# Patient Record
Sex: Male | Born: 2000 | Race: White | Hispanic: No | Marital: Single | Smoking: Never smoker
Health system: Southern US, Community
[De-identification: ages and names within clinical notes are randomized; demographics above are authoritative.]

## PROBLEM LIST (undated history)

## (undated) DIAGNOSIS — R55 Syncope and collapse: Secondary | ICD-10-CM

---

## 2018-01-30 ENCOUNTER — Emergency Department
Admission: EM | Admit: 2018-01-30 | Discharge: 2018-01-30 | Disposition: A | Discharge status: Home / Self Care | Payer: TRICARE Prime—HMO | Attending: Emergency Medicine | Admitting: Emergency Medicine

## 2018-01-30 ENCOUNTER — Emergency Department: Payer: TRICARE Prime—HMO

## 2018-01-30 DIAGNOSIS — R079 Chest pain, unspecified: Secondary | ICD-10-CM | POA: Insufficient documentation

## 2018-01-30 HISTORY — DX: Syncope and collapse: R55

## 2018-01-30 LAB — CBC AND DIFFERENTIAL
Absolute NRBC: 0 10*3/uL (ref 0.00–0.00)
Basophils Absolute Automated: 0.01 10*3/uL (ref 0.00–0.08)
Basophils Automated: 0.2 %
Eosinophils Absolute Automated: 0.14 10*3/uL (ref 0.00–0.44)
Eosinophils Automated: 3.1 %
Hematocrit: 42.8 % (ref 37.6–49.6)
Hgb: 14.8 g/dL (ref 12.5–17.1)
Immature Granulocytes Absolute: 0.01 10*3/uL (ref 0.00–0.07)
Immature Granulocytes: 0.2 %
Lymphocytes Absolute Automated: 1.28 10*3/uL (ref 0.42–3.22)
Lymphocytes Automated: 27.9 %
MCH: 30.2 pg (ref 25.1–33.5)
MCHC: 34.6 g/dL (ref 31.5–35.8)
MCV: 87.3 fL (ref 78.0–96.0)
MPV: 9.6 fL (ref 8.9–12.5)
Monocytes Absolute Automated: 0.43 10*3/uL (ref 0.21–0.85)
Monocytes: 9.4 %
Neutrophils Absolute: 2.71 10*3/uL (ref 1.10–6.33)
Neutrophils: 59.2 %
Nucleated RBC: 0 /100 WBC (ref 0.0–0.0)
Platelets: 161 10*3/uL (ref 142–346)
RBC: 4.9 10*6/uL (ref 4.20–5.90)
RDW: 12 % (ref 11–15)
WBC: 4.58 10*3/uL (ref 3.10–9.50)

## 2018-01-30 LAB — COMPREHENSIVE METABOLIC PANEL
ALT: 25 U/L (ref 0–55)
AST (SGOT): 23 U/L (ref 5–34)
Albumin/Globulin Ratio: 2 (ref 0.9–2.2)
Albumin: 4.3 g/dL (ref 3.5–5.0)
Alkaline Phosphatase: 64 U/L — ABNORMAL LOW (ref 65–260)
Anion Gap: 9 (ref 5.0–15.0)
BUN: 18 mg/dL (ref 9.0–28.0)
Bilirubin, Total: 0.6 mg/dL (ref 0.2–1.2)
CO2: 24 mEq/L (ref 22–29)
Calcium: 9.1 mg/dL (ref 8.5–10.5)
Chloride: 108 mEq/L (ref 100–111)
Creatinine: 0.9 mg/dL (ref 0.7–1.3)
Globulin: 2.2 g/dL (ref 2.0–3.6)
Glucose: 87 mg/dL (ref 70–100)
Potassium: 4 mEq/L (ref 3.5–5.1)
Protein, Total: 6.5 g/dL (ref 6.0–8.3)
Sodium: 141 mEq/L (ref 136–145)

## 2018-01-30 LAB — HEMOLYSIS INDEX: Hemolysis Index: 11 (ref 0–18)

## 2018-01-30 LAB — TROPONIN I: Troponin I: <0.01 ng/mL (ref 0.00–0.09)

## 2018-01-30 NOTE — Discharge Instructions (Signed)
No exercise until cleared by a cardiologist.     Chest Pain of Unclear Etiology    You have been seen for chest pain. The cause of your pain is not yet known.    Your doctor has learned about your medical history, examined you, and checked any tests that were done. Still, it is unclear why you are having pain. The doctor thinks there is only a very small chance that your pain is caused by a life-threatening condition. Later, your primary care doctor might do more tests or check you again.    Sometimes chest pain is caused by a dangerous condition, like a heart attack, aorta injury, blood clot in the lung, or collapsed lung. It is unlikely that your pain is caused by a life-threatening condition if: Your chest pain lasts only a few seconds at a time; you are not short of breath, nauseated (sick to your stomach), sweaty, or lightheaded; your pain gets worse when you twist or bend; your pain improves with exercise or hard work.    Chest pain is serious. It is VERY IMPORTANT that you follow up with your regular doctor and seek medical attention immediately here or at the nearest Emergency Department if your symptoms become worse or they change.    YOU SHOULD SEEK MEDICAL ATTENTION IMMEDIATELY, EITHER HERE OR AT THE NEAREST EMERGENCY DEPARTMENT, IF ANY OF THE FOLLOWING OCCURS:   Your pain gets worse.   Your pain makes you short of breath, nauseated, or sweaty.   Your pain gets worse when you walk, go up stairs, or exert yourself.   You feel weak, lightheaded, or faint.   It hurts to breathe.   Your leg swells.   Your symptoms get worse or you have new symptoms or concerns.

## 2018-01-30 NOTE — ED Notes (Signed)
Bed: GR15  Expected date:   Expected time:   Means of arrival:   Comments:  Building control surveyor

## 2018-01-30 NOTE — ED Triage Notes (Signed)
Patient [employer] duty [employer] BIBA with c/c of non-radiating left sided Chest Pain and Shortness of Breath 30 minutes PTA. Patient states symptoms began during a routine PT Test. Patient states pain worsens with inspiration. Patient reports Hx: Left Sided Internal Loop Recorder placement 3 years ago after having a syncope episode. PT NAD

## 2018-01-30 NOTE — ED Provider Notes (Signed)
EMERGENCY DEPARTMENT HISTORY AND PHYSICAL EXAM     Physician/Midlevel provider first contact with patient: 01/30/18 1610         Date: 01/30/2018  Patient Name: Gregory Steele    History of Presenting Illness     Chief Complaint   Patient presents with   . Chest Pain   . Shortness of Breath       History Provided By: Patient    Chief Complaint: Chest pain  Duration: Since earlier today  Timing:  Intermittent  Location: Diffuse  Quality: uncomfortable  Severity: Moderate  Exacerbating factors: Onset by activity  Alleviating factors: None  Associated Symptoms: SOB  Pertinent Negatives: None    Additional History: Gregory Steele is a 17 y.o. male presenting to the ED with intermittent uncomfortable diffuse CP since earlier today. The pt states that he was at a PT today when he felt the CP and SOB. He states that this level of activity is normal for him and he has had these similar sxs in the past. He states that he has been seen for these sxs in the past but does not remember having any particular results.     PCP: No primary care provider on file.  SPECIALISTS:    No current facility-administered medications for this encounter.      No current outpatient medications on file.       Past History     Past Medical History:  Past Medical History:   Diagnosis Date   . Syncope        Past Surgical History:  Past Surgical History:   Procedure Laterality Date   . CARDIAC ASSIST DEVICE INSERTION  2016    Internal Loop Recorder   . TONSILLECTOMY         Family History:  History reviewed. No pertinent family history.    Social History:  Social History     Tobacco Use   . Smoking status: Never Smoker   . Smokeless tobacco: Current User     Types: Chew   Substance Use Topics   . Alcohol use: Not on file   . Drug use: Not on file       Allergies:  No Known Allergies    Review of Systems     Review of Systems   Constitutional: Negative for chills and fever.   Respiratory: Positive for shortness of breath.    Cardiovascular: Positive for chest  pain.   Gastrointestinal: Negative for diarrhea, nausea and vomiting.   All other systems reviewed and are negative.      Physical Exam   BP 108/59   Pulse 68   Temp 98.1 F (36.7 C)   Resp 22   Ht 5\' 10"  (1.778 m)   Wt 70.3 kg   SpO2 98%   BMI 22.24 kg/m     Physical Exam   Constitutional: He is oriented to person, place, and time. He appears well-developed and well-nourished. No distress.   HENT:   Head: Normocephalic and atraumatic.   Mouth/Throat: Oropharynx is clear and moist.   Eyes: Conjunctivae and EOM are normal.   Neck: Normal range of motion. Neck supple.   Cardiovascular: Normal rate, regular rhythm and normal heart sounds. Exam reveals no gallop and no friction rub.   No murmur heard.  Pulmonary/Chest: Effort normal and breath sounds normal. No respiratory distress. He has no wheezes. He has no rales.   Abdominal: Soft. Bowel sounds are normal. He exhibits no distension. There is no tenderness.  There is no rebound and no guarding. Musculoskeletal: Normal range of motion.         General: No edema.     Neurological: He is alert and oriented to person, place, and time.   Skin: Skin is warm and dry.   Psychiatric: He has a normal mood and affect. His behavior is normal.   Nursing note and vitals reviewed.          Diagnostic Study Results     Labs -     Results     Procedure Component Value Units Date/Time    Troponin I [[id number]] Collected:  01/30/18 0937    Specimen:  Blood Updated:  01/30/18 1023     Troponin I <0.01 ng/mL     Comprehensive metabolic panel [[id number]]  (Abnormal) Collected:  01/30/18 0937    Specimen:  Blood Updated:  01/30/18 1016     Glucose 87 mg/dL      BUN 78.2 mg/dL      Creatinine 0.9 mg/dL      Sodium 956 mEq/L      Potassium 4.0 mEq/L      Chloride 108 mEq/L      CO2 24 mEq/L      Calcium 9.1 mg/dL      Protein, Total 6.5 g/dL      Albumin 4.3 g/dL      AST (SGOT) 23 U/L      ALT 25 U/L      Alkaline Phosphatase 64 U/L      Bilirubin, Total 0.6 mg/dL      Globulin 2.2  g/dL      Albumin/Globulin Ratio 2.0     Anion Gap 9.0    Hemolysis index [[id number]] Collected:  01/30/18 0937     Updated:  01/30/18 1016     Hemolysis Index 11    CBC with differential [[id number]] Collected:  01/30/18 0937    Specimen:  Blood Updated:  01/30/18 1000     WBC 4.58 x10 3/uL      Hgb 14.8 g/dL      Hematocrit 41.3 %      Platelets 161 x10 3/uL      RBC 4.90 x10 6/uL      MCV 87.3 fL      MCH 30.2 pg      MCHC 34.6 g/dL      RDW 12 %      MPV 9.6 fL      Neutrophils 59.2 %      Lymphocytes Automated 27.9 %      Monocytes 9.4 %      Eosinophils Automated 3.1 %      Basophils Automated 0.2 %      Immature Granulocyte 0.2 %      Nucleated RBC 0.0 /100 WBC      Neutrophils Absolute 2.71 x10 3/uL      Abs Lymph Automated 1.28 x10 3/uL      Abs Mono Automated 0.43 x10 3/uL      Abs Eos Automated 0.14 x10 3/uL      Absolute Baso Automated 0.01 x10 3/uL      Absolute Immature Granulocyte 0.01 x10 3/uL      Absolute NRBC 0.00 x10 3/uL           Radiologic Studies -   Radiology Results (24 Hour)     Procedure Component Value Units Date/Time    Chest AP Portable [[id number]] Collected:  01/30/18 0936    Order Status:  Completed Updated:  01/30/18 0940    Narrative:       PORTABLE CHEST    CLINICAL STATEMENT: chest pain    COMPARISON: No prior studies are available for comparison.    FINDINGS: The lungs are clear. The cardiomediastinal silhouette is  unremarkable.          Impression:         No acute cardiopulmonary disease.    Fonnie Mu, MD   01/30/2018 9:36 AM      .    Medical Decision Making   I am the first provider for this patient.    I reviewed the vital signs, available nursing notes, past medical history, past surgical history, family history and social history.    Vital Signs-Reviewed the patient's vital signs.     Patient Vitals for the past 12 hrs:   BP Temp Pulse Resp   01/30/18 0858 108/59 98.1 F (36.7 C) 68 22       Pulse Oximetry Analysis - Normal    Cardiac Monitor:  Rate: 68  Rhythm:  Normal Sinus Rhythm     EKG:  Interpreted by the EP.   Time Interpreted: 0850   Rate: 85   Rhythm: Normal Sinus Rhythm w/ arrhthymia    Interpretation: right ventriclualr conduction delay normal axis, normal intermals, no stemis   Comparison: No prior study is available for comparison.    Clinical Decision Support:    Heart Score      Value   History  0   EKG  1   Risk Factors  0   Total (with age)  1   Onset of pain (time of START of last episode of chest pain)?  >6 hrs ago            ED Course:     9:30 AM - Discussed ED treatment plan including blood work w/ pt. Pt agrees.    10:33 AM - Updated pt on results. Discussed pt's diagnosis, f/u with Card[facility], home self care, discharge instructions, and return precautions with patient. Possibility of evolving illness reviewed. All questions solicited and addressed. Patient states understanding and amenable to discharge.       Provider Notes:   Based on my history, physical exam, and diagnostic evaluation, the patient appears to have symptoms consistent with low risk chest pain. The physical exam was unremarkable including normal chest and respiratory exam. The pt's diagnostic evaluation including laboratory data, EKG, and x-ray were unremarkable. The HEART score is 1, and according to the low risk chest pain pathway a second troponin 1 drawn at 3 hours.  Based on this evaluation, I do not believe the symptoms are related to acute ischemic chest pain. Pt will be discharged to follow-up with cardiology follow up. Patient was advised of our evaluation and told to return to the emergency department if symptoms change, worsen, or new symptoms develop.  [facility] called for arrangement of outpatient follow up. Pt advised not to return to physical exercise until cleared by a cardiology.     Diagnosis     Clinical Impression:   1. Chest pain, unspecified type        Treatment Plan:   ED Disposition     ED Disposition Condition Date/Time Comment    Discharge  Wed Jan 30, 2018 10:33 AM Cammy Brochure discharge to home/self care.    Condition at disposition: Stable            _______________________________  Attestations: This note is prepared by Janann August, acting as scribe for Mariane Duval, MD. The scribe's documentation has been prepared under my direction and personally reviewed by me in its entirety.  I confirm that the note above accurately reflects all work, treatment, procedures, and medical decision making performed by me.    _______________________________       Verlee Rossetti, MD  01/30/18 1047

## 2018-01-31 LAB — ECG 12-LEAD
Atrial Rate: 85 {beats}/min
P Axis: 71 degrees
P-R Interval: 124 ms
Q-T Interval: 388 ms
QRS Duration: 108 ms
QTC Calculation (Bezet): 461 ms
R Axis: 67 degrees
T Axis: 35 degrees
Ventricular Rate: 85 {beats}/min

## 2021-06-09 IMAGING — CR [HOSPITAL] ORBITS
1 series · 2 of 2 positions shown · non-contrast
Comparison: None.

HISTORY/INDICATIONS: Pre-MRI screening.
TECHNIQUE: Orbits 2 views

[Series 1: w waters pa · 0.15mm/px · 2 of 2 slices shown]
[im 1/2]
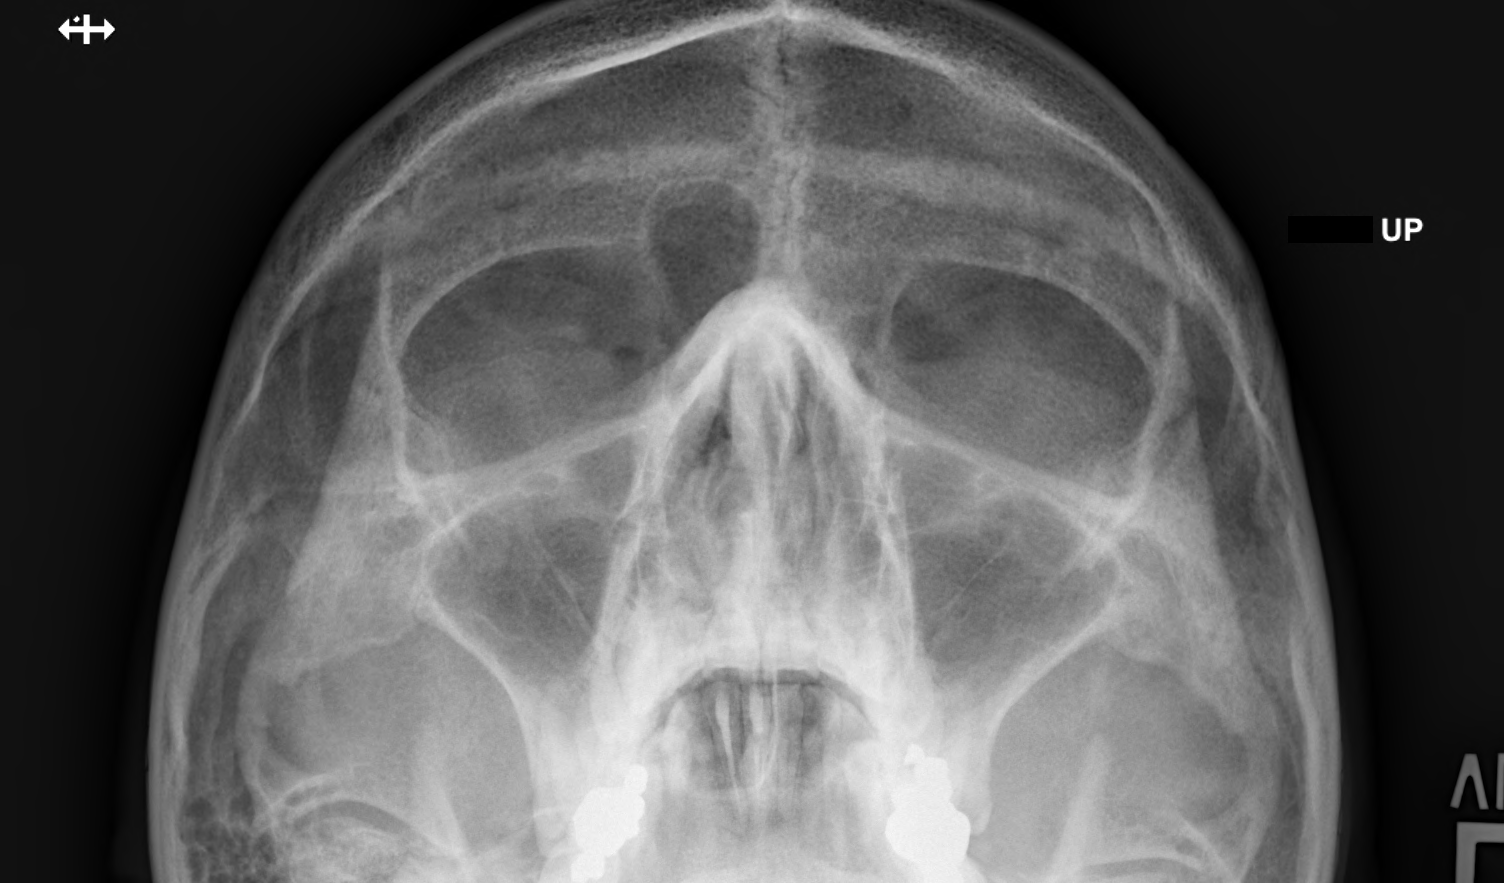
[im 2/2]
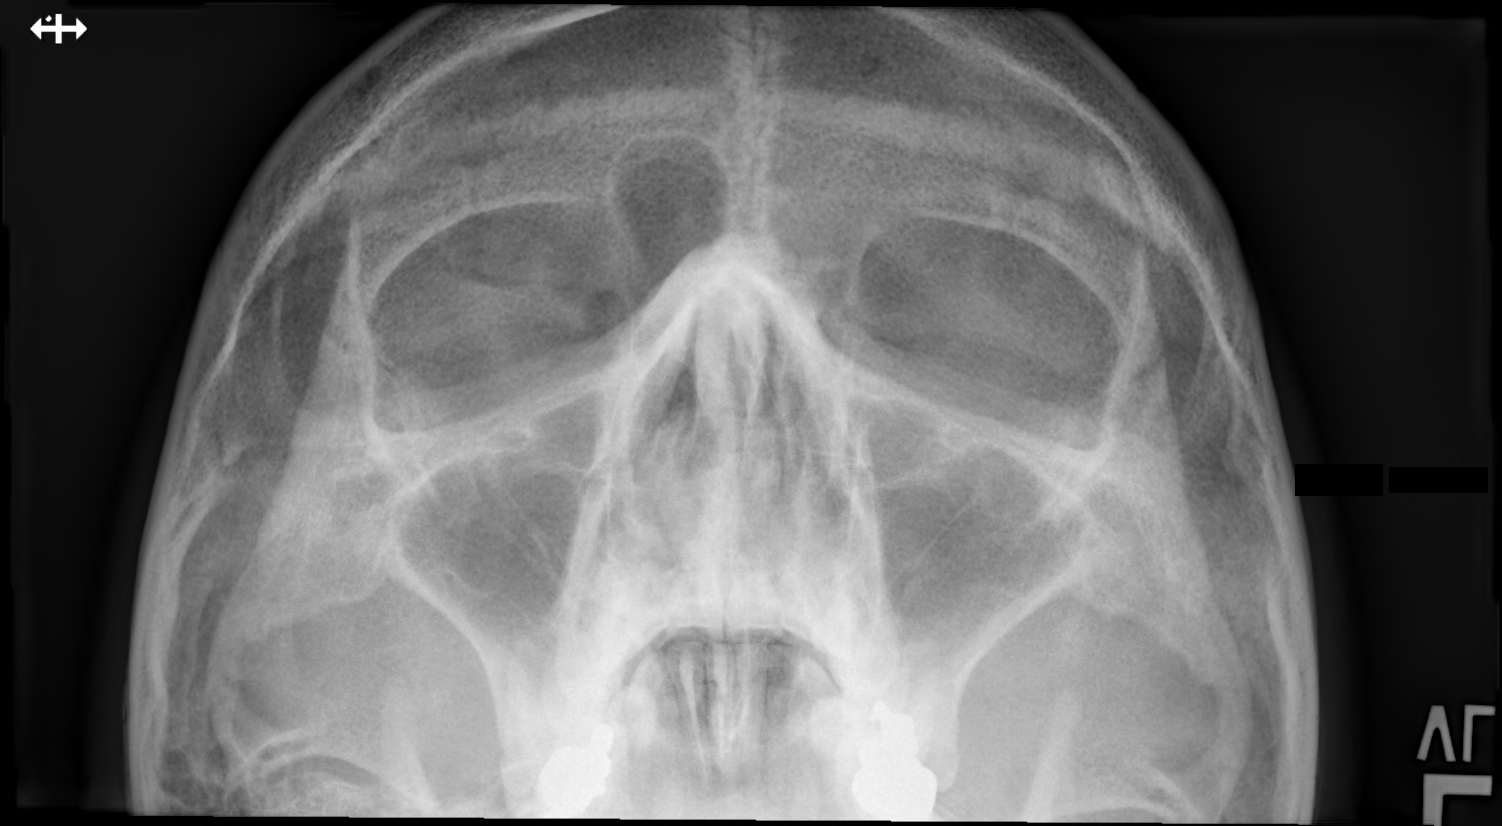

[2 of 2 positions shown; findings below may reference images not displayed]

FINDINGS: No radiopaque foreign object to preclude MRI.
IMPRESSION: The patient may undergo MRI.

## 2021-06-09 IMAGING — MR MRI BRAIN WO CONTRAST
8 series · 31 of 48 positions shown · non-contrast
Comparison: None.

INDICATION: 20-year-old male. Dizziness and giddiness.
TECHNIQUE: Multiplanar multisequence MRI of the brain was performed without intravenous contrast.

[Series 5: flair_axial_fs · axial · 4.0mm · 0.90mm/px · z∈[-110,+43]mm · 3 of 32 slices shown]
[im 1/32]
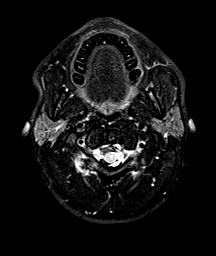
[im 16/32]
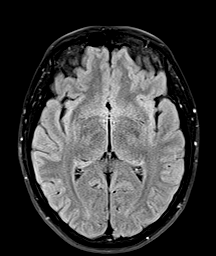
[im 32/32]
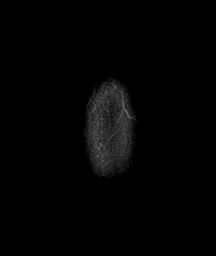

[Series 6: t2_axial · axial · 4.0mm · 0.36mm/px · z∈[-110,+43]mm · 2 of 32 slices shown]
[im 1/32]
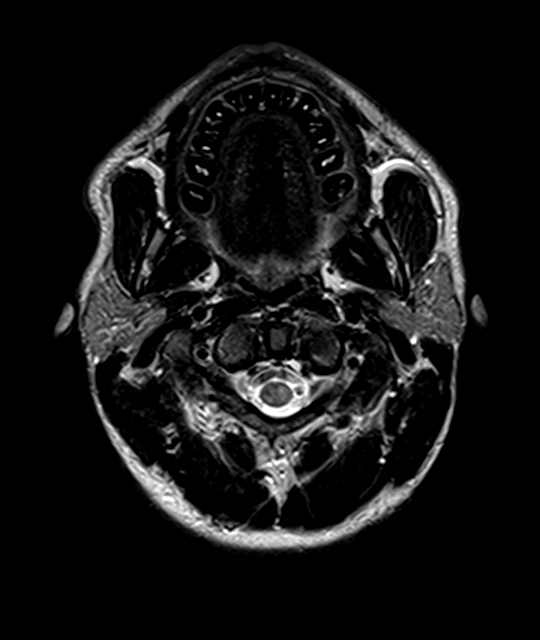
[im 32/32]
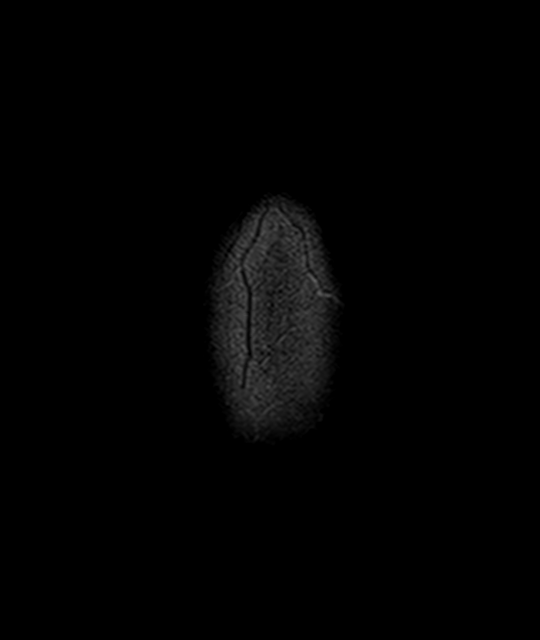

[Series 7: DWI · axial · 4.0mm · 1.31mm/px · z∈[-111,+43]mm · 2 of 32 slices shown (1 of 2)]
[im 1/32]
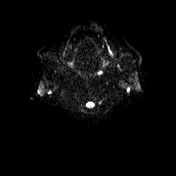
[im 32/32]
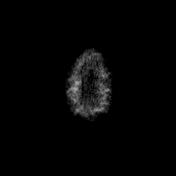

[Series 8: DWI · axial · 4.0mm · 1.31mm/px · z∈[-111,+43]mm · 2 of 31 slices shown (2 of 2)]
[im 1/31]
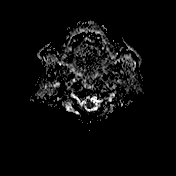
[im 31/31]
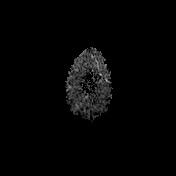

[Series 9: flash_axial · axial · 4.0mm · 0.90mm/px · z∈[-110,+43]mm · 2 of 32 slices shown]
[im 1/32]
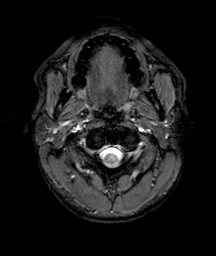
[im 32/32]
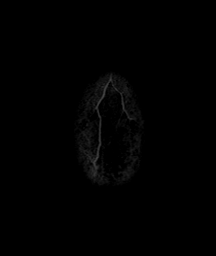

[Series 10: mprage_pre (no fs) · axial · 0.9mm · 0.92mm/px · z∈[-114,+51]mm · 8 of 192 slices shown]
[im 1/192]
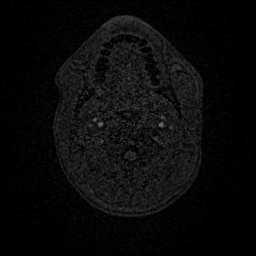
[im 32/192]
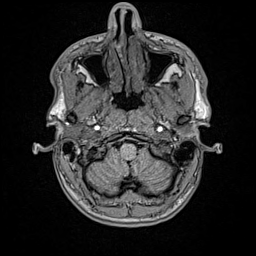
[im 64/192]
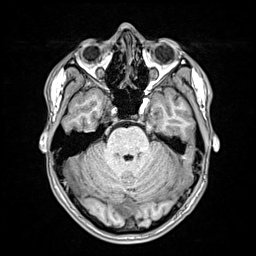
[im 80/192]
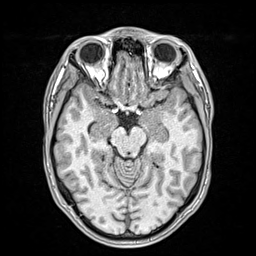
[im 112/192]
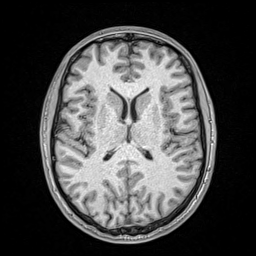
[im 128/192]
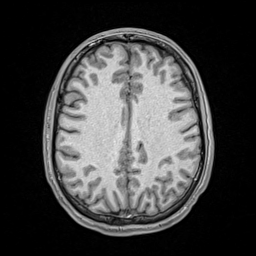
[im 160/192]
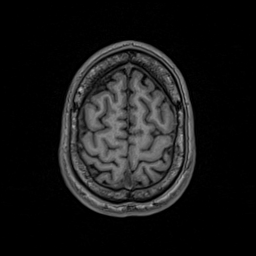
[im 192/192]
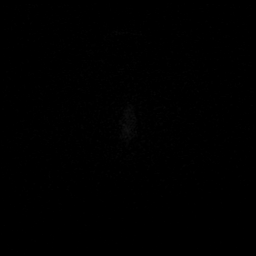

[Series 11: mprage_pre (no fs)_mpr_sagittal · sagittal · 1.0mm · 0.41mm/px · 8 of 150 slices shown]
[im 1/150]
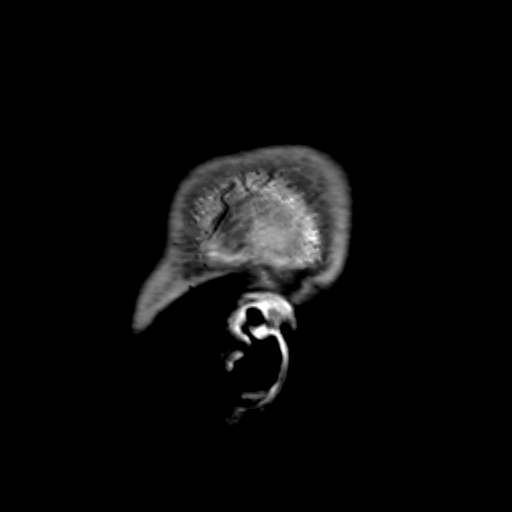
[im 17/150]
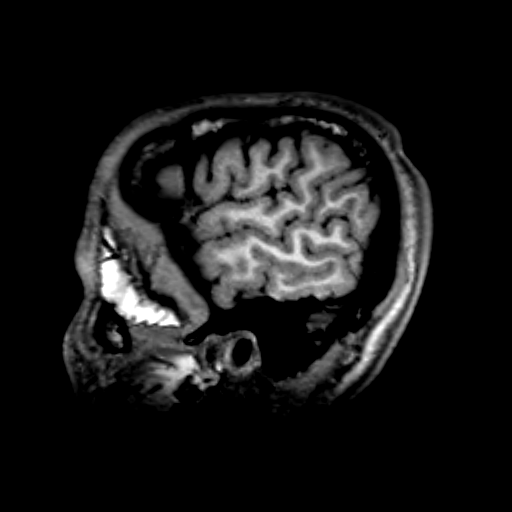
[im 50/150]
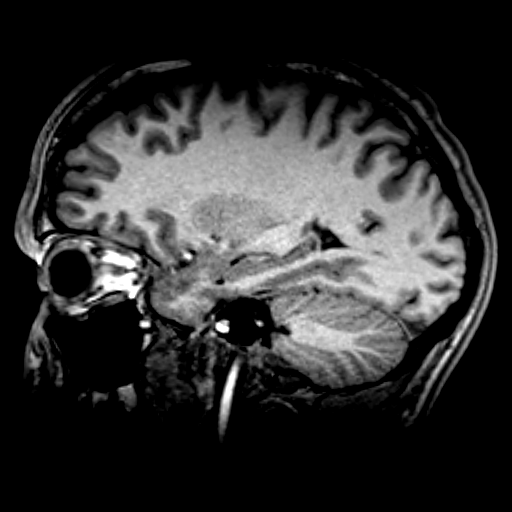
[im 67/150]
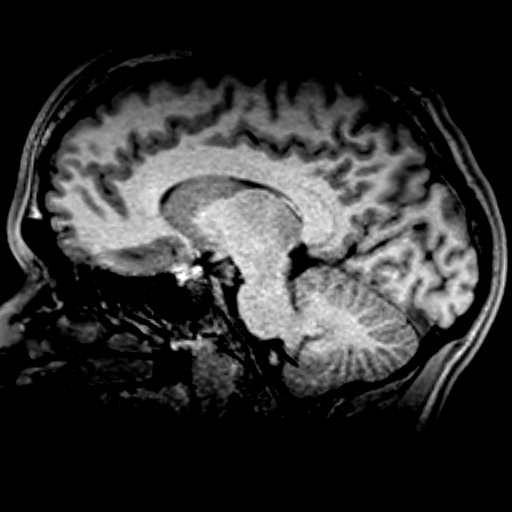
[im 83/150]
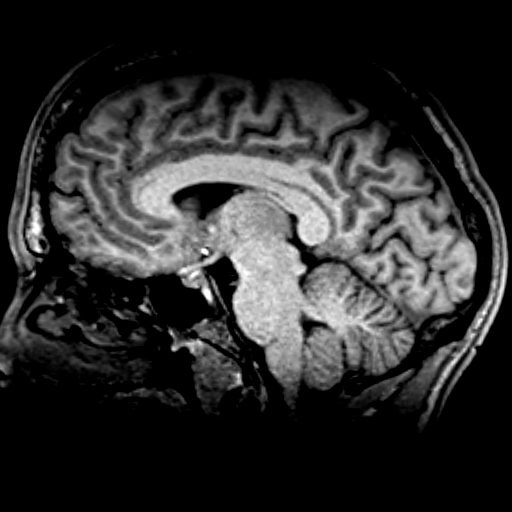
[im 100/150]
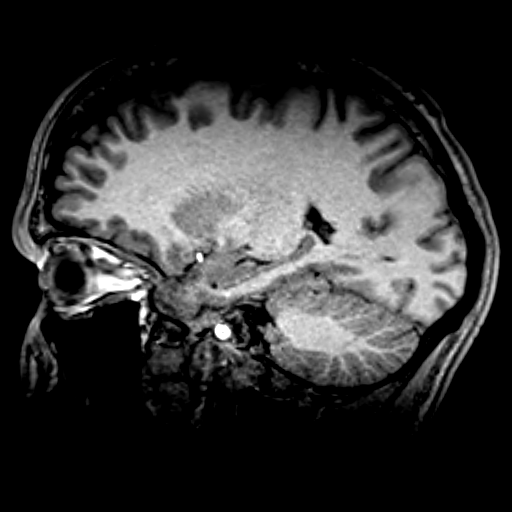
[im 133/150]
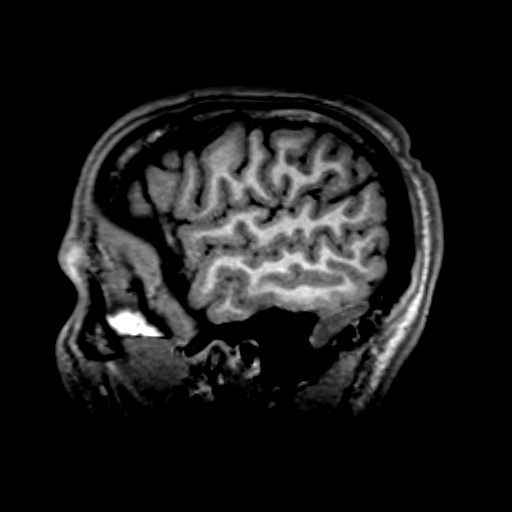
[im 150/150]
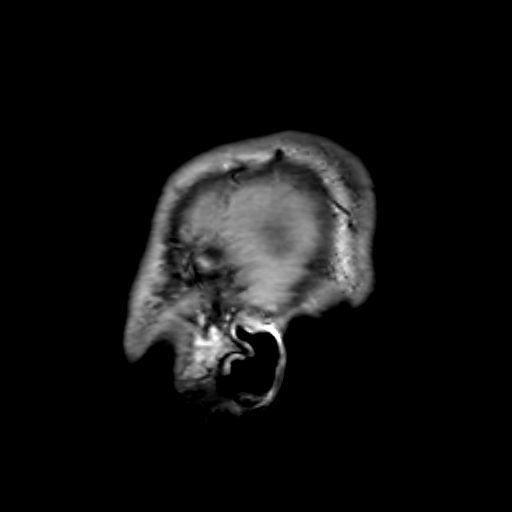

[Series 12: mprage_pre (no fs)_mpr_coronal · coronal · 1.0mm · 0.45mm/px · 4 of 209 slices shown]
[im 1/209]
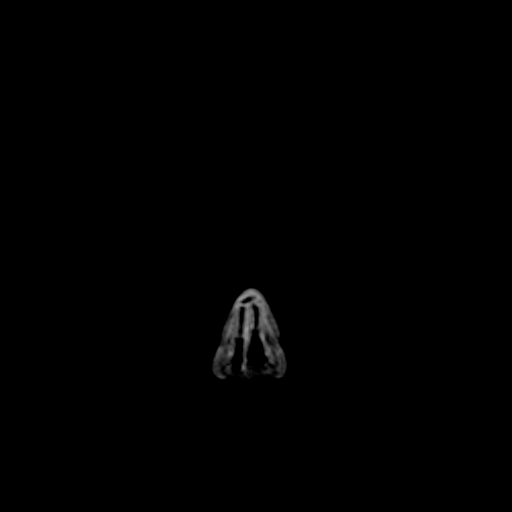
[im 33/209]
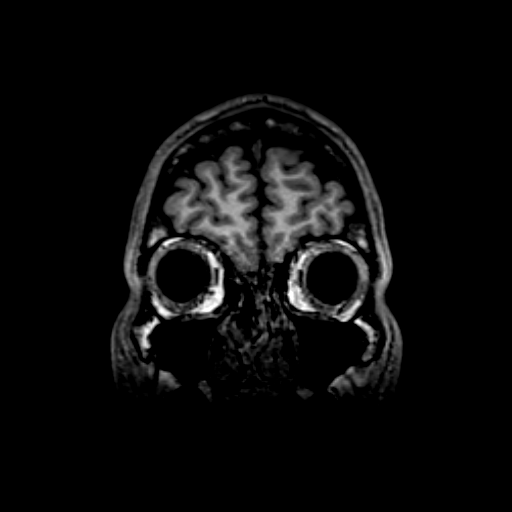
[im 65/209]
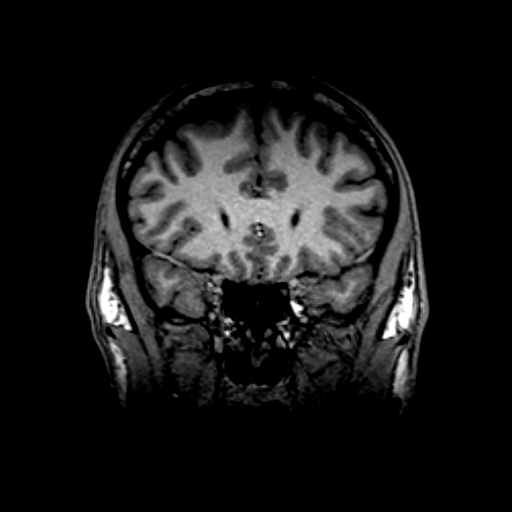
[im 97/209]
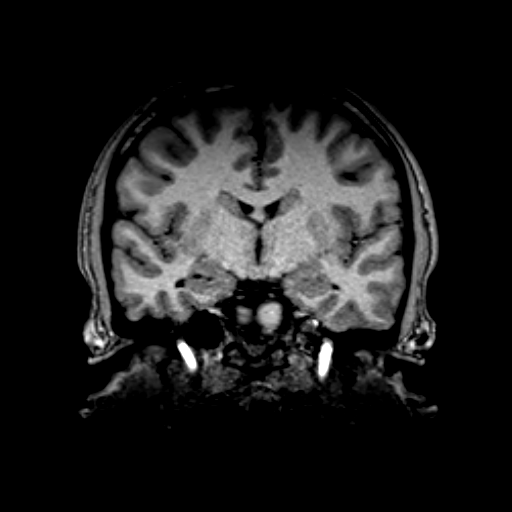

[31 of 48 positions shown; findings below may reference images not displayed]

FINDINGS: Brain parenchyma: No acute infarct or hemorrhage. No significant parenchymal abnormality. Preserved vascular flow voids.

Ventricles: Minimal cerebellar tonsillar ectopia by 2 to 3 mm with normal shape of the tonsils, which does not meet criteria for Chiari malformation. No midline shift. No hydrocephalus.

Extra-axial spaces: No extra-axial fluid collection.

Extracranial structures: Mild paranasal sinus mucosal disease. T2 hyperintense lesion in the right nasal cavity measuring 13 x 25 mm, likely a polyp. Marrow signal normal. Orbits unremarkable. Soft tissues normal.
IMPRESSION: 1.
No intracranial abnormality to explain patient's symptoms.

2.
Mild paranasal sinus mucosal disease. Likely right nasal cavity polyp measuring up to 25 mm.

## 2021-12-14 IMAGING — US BX AXILLARY BREAST LYMPH NODE RT
1 series · 13 of 17 positions shown · non-contrast
Comparison: Same day ultrasound and outside facility exams, most recently 11/01/2021
PROCEDURE:
PATIENT CONSENT: Prior to the procedure risks, complications, alternatives, and a description of the procedure were discussed.

Images Obtained from [facility]
INDICATION: Ultrasound-guided biopsy for a right axillary lymph node.

[Series 2: bx axillary breast lymph node right · 13 of 17 slices shown]
[im 1/17]
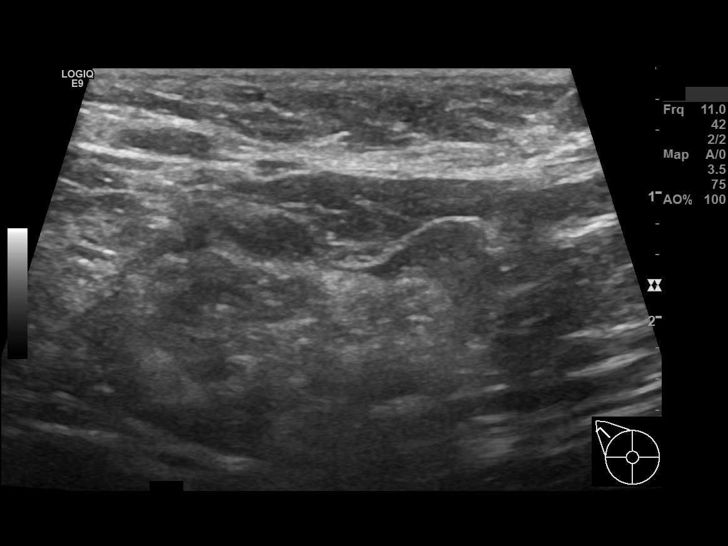
[im 2/17]
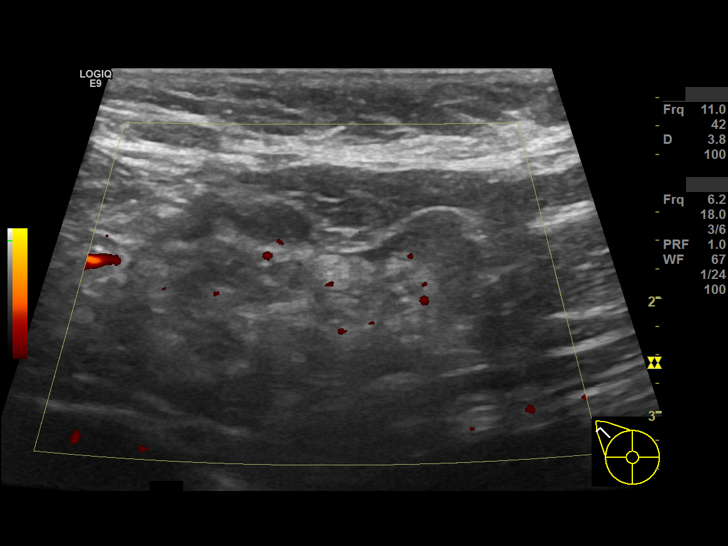
[im 4/17]
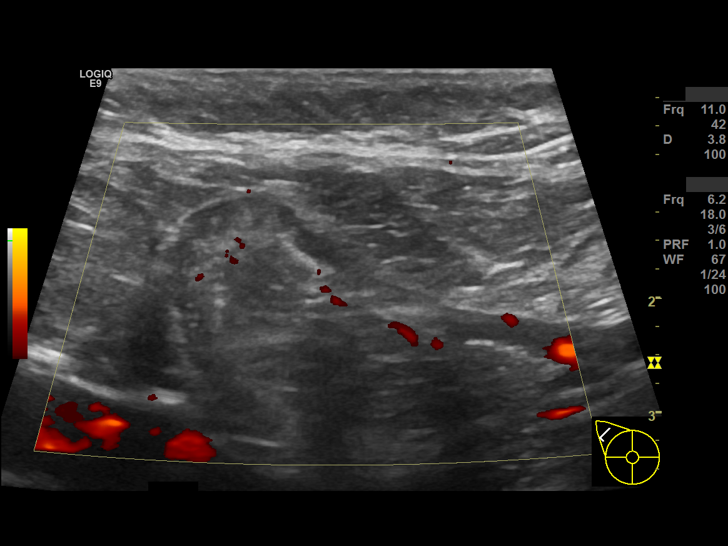
[im 5/17]
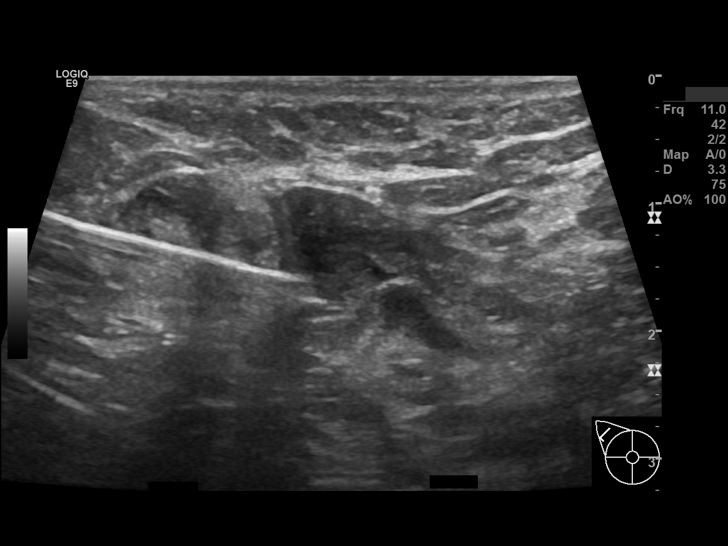
[im 6/17]
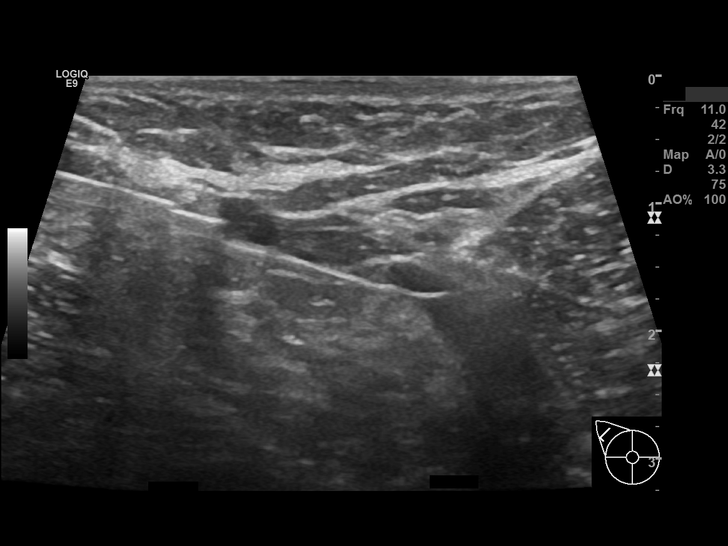
[im 8/17]
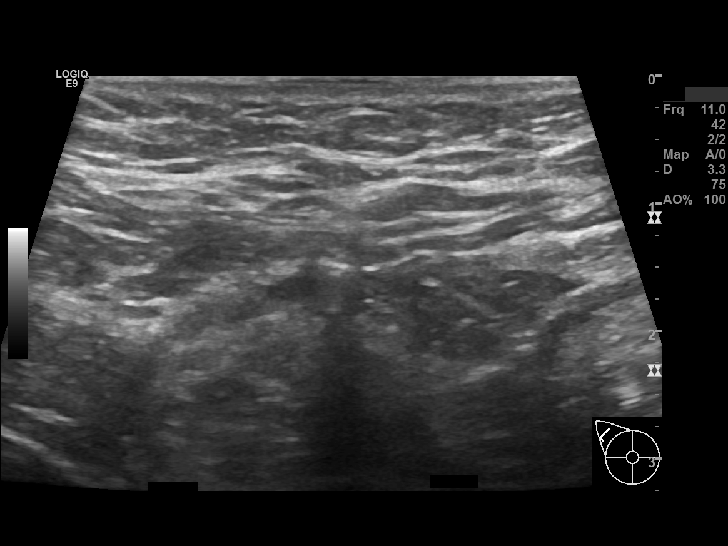
[im 9/17]
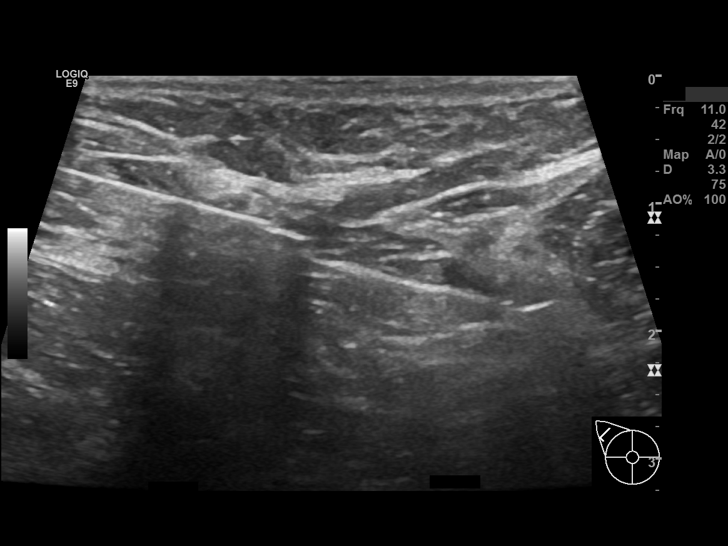
[im 10/17]
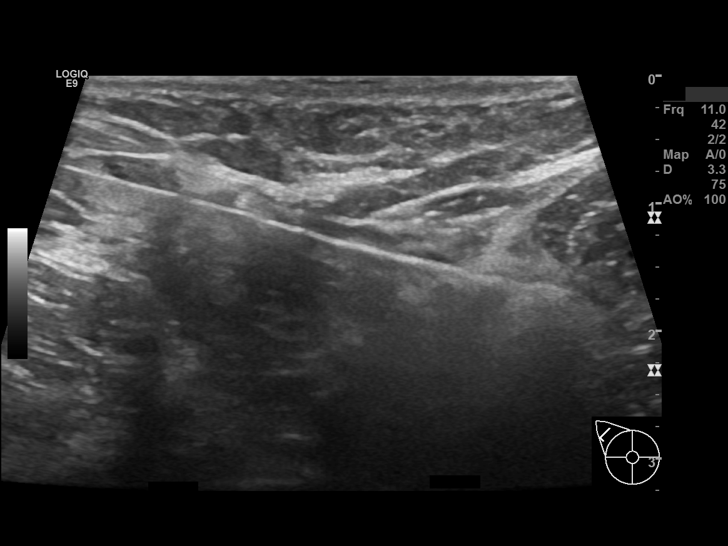
[im 12/17]
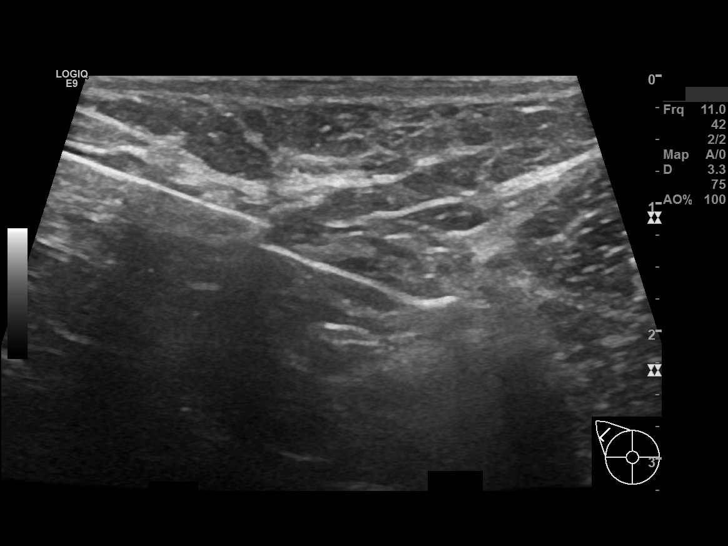
[im 13/17]
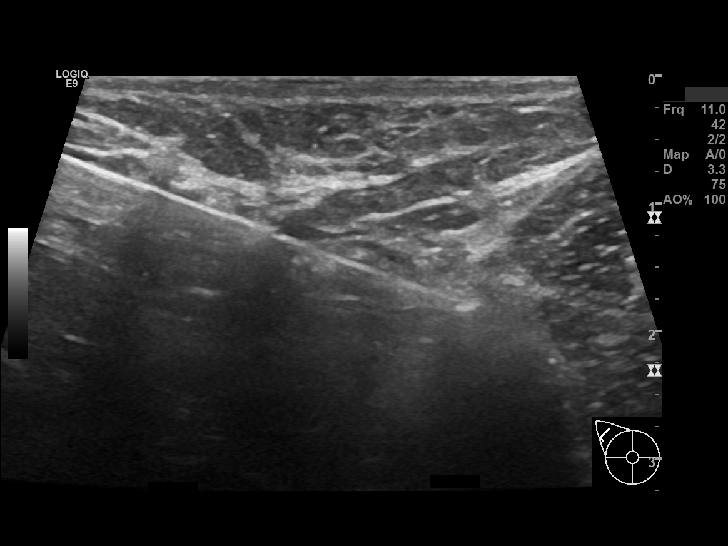
[im 14/17]
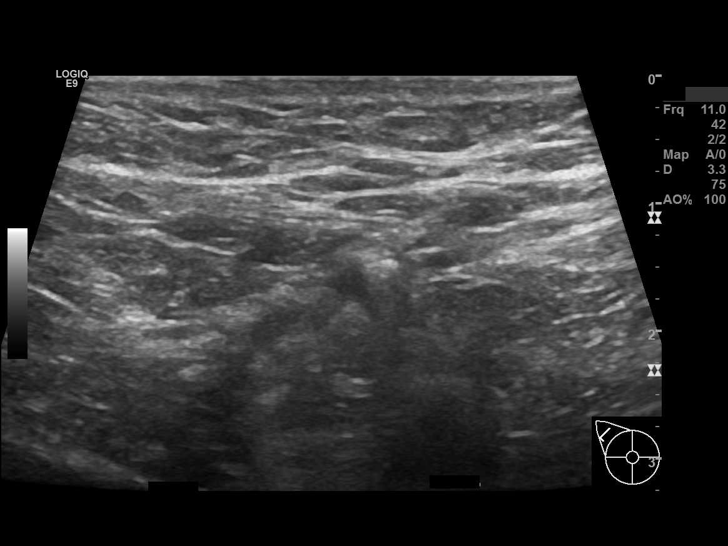
[im 16/17]
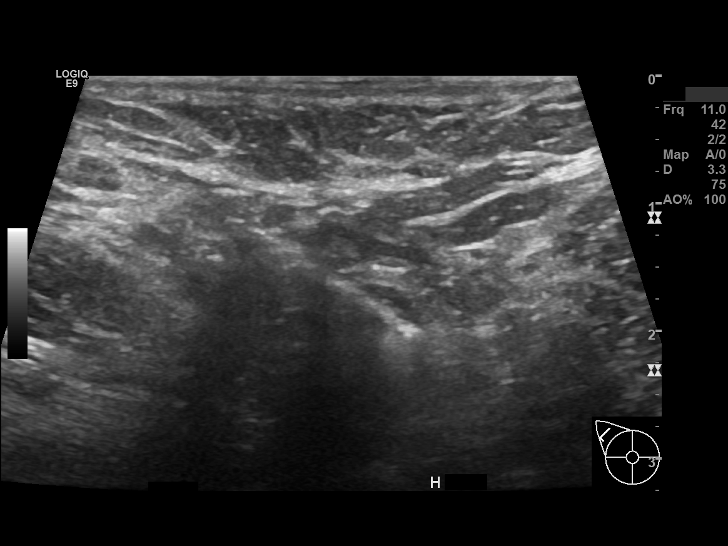
[im 17/17]
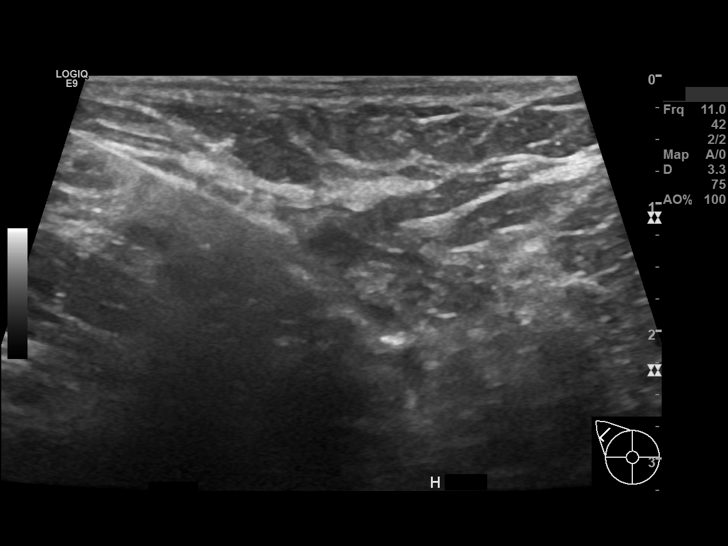

[13 of 17 positions shown; findings below may reference images not displayed]

All questions were answered.  Informed consent was obtained and signed.
PROCEDURE DESCRIPTION:
An ultrasound guided biopsy using real-time ultrasound was performed for the right axillary lymph node.  This was described on the previous ultrasound report.  The skin was prepped in the usual
manner.  Local anesthetic was administered to the access site.  A skin nick was made in the breast.  A 14 gauge biopsy needle was placed adjacent to the abnormality under ultrasound guidance.  Once
the needle was documented to be in the correct location, multiple specimens were obtained using the Marquee biopsy needle.  A hydromark butterfly-shaped clip was inserted into the biopsy cavity.  A
skin adhesive was applied to the access site.  Post procedure ultrasound imaging demonstrates the clip at the targeted area.  The specimens were sent to the laboratory for pathological analysis.
A female technologist was present throughout the procedure.
IMPRESSION: 1.  The ultrasound guided biopsy of the right axillary lymph node was successful with no apparent post procedure complications.
2.  Pathology indicates lymphoid proliferation. Per the pathology report, it was stated that "core biopsies of lymph node, particularly in one as sizable this, have decreased sensitivity for
characterizing focal pathology (e.g. certain subtypes of Lica)."
3.  Pathology results are concordant with imaging findings.
4.  After discussion with the patient, a repeat biopsy will be performed with cores sent for both pathology and RPMI, as opposed to a surgical consultation for removal.
Results and recommendations were discussed with the patient.

## 2021-12-14 IMAGING — US US AXILLARY RT (BREAST)
1 series · 10 of 10 positions shown · non-contrast
Comparison: The present examination has been compared to prior imaging studies.

Images Obtained from [facility]
HISTORY: Patient is 21 years old and is seen for preprocedural ultrasound guided biopsy imaging for a lymph node in the right axilla that has reportedly increased in size per outside institution reports.
TECHNIQUE: Real-time ultrasound of the right axilla was performed.

[Series 1: us axillary right (breast) · 10 of 10 slices shown]
[im 1/10]
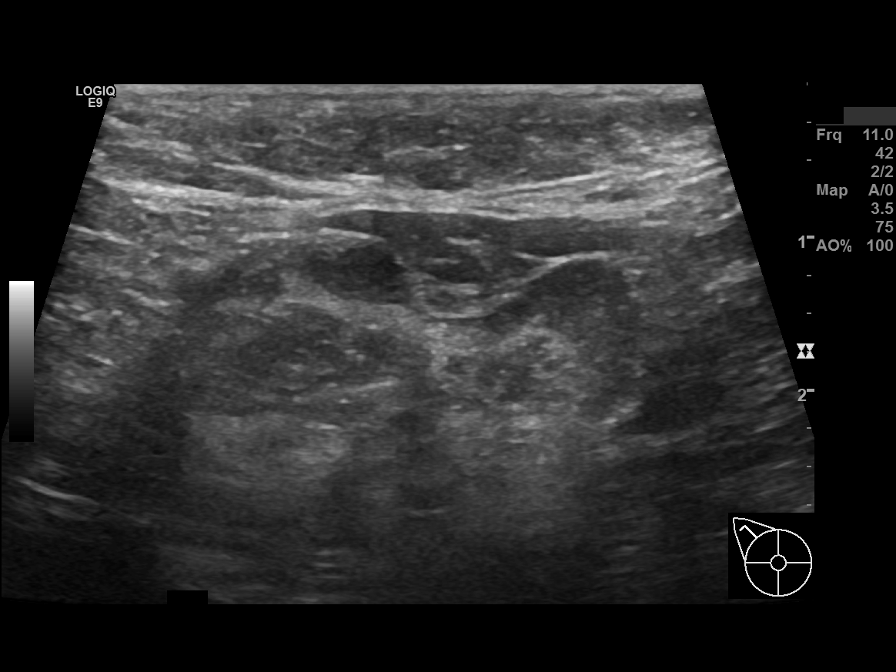
[im 2/10]
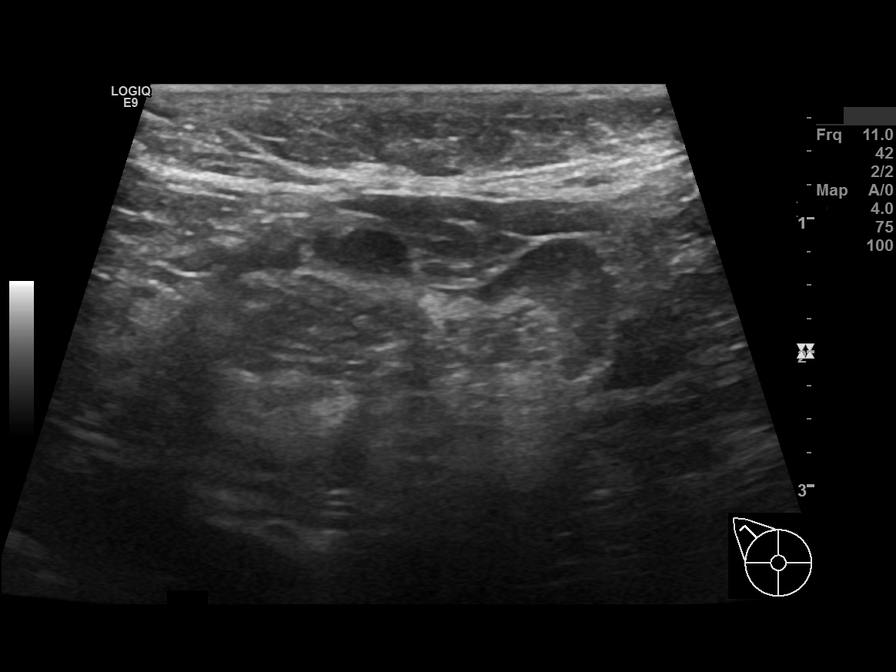
[im 3/10]
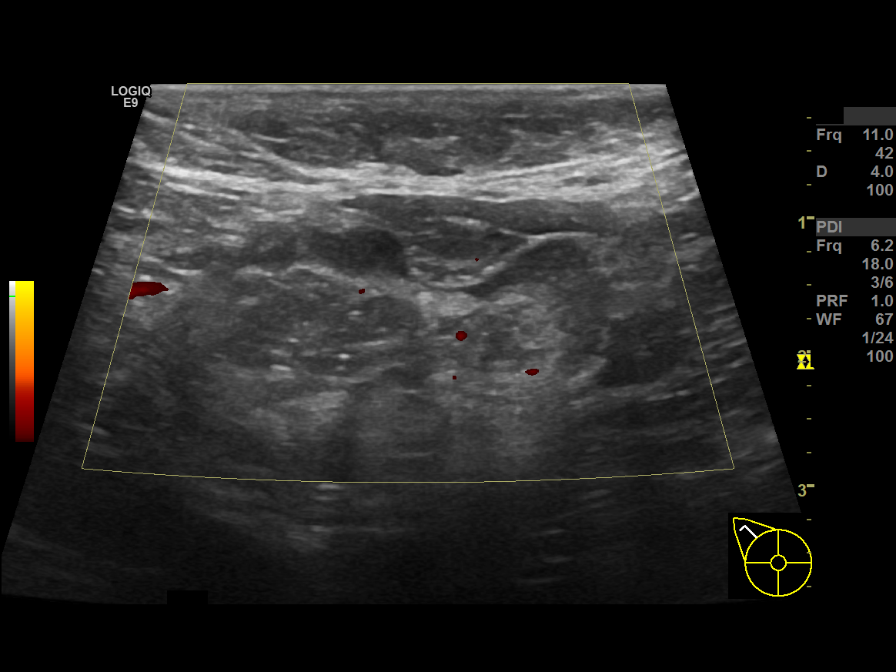
[im 4/10]
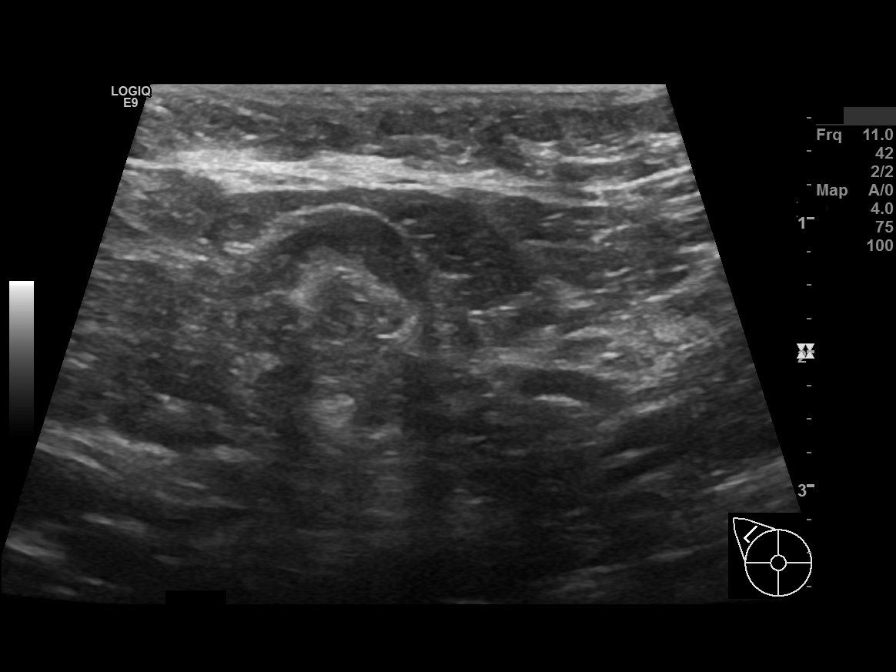
[im 5/10]
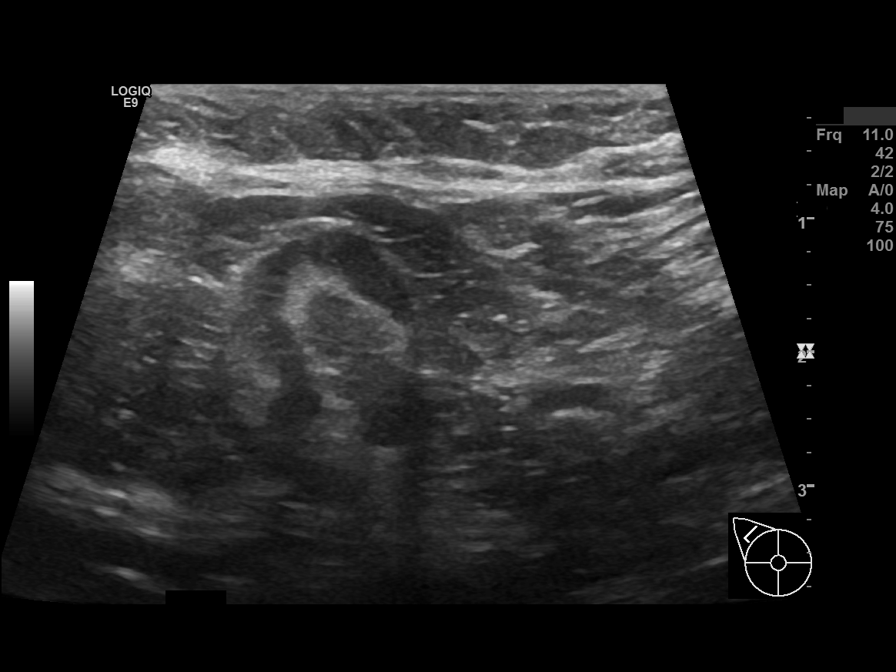
[im 6/10]
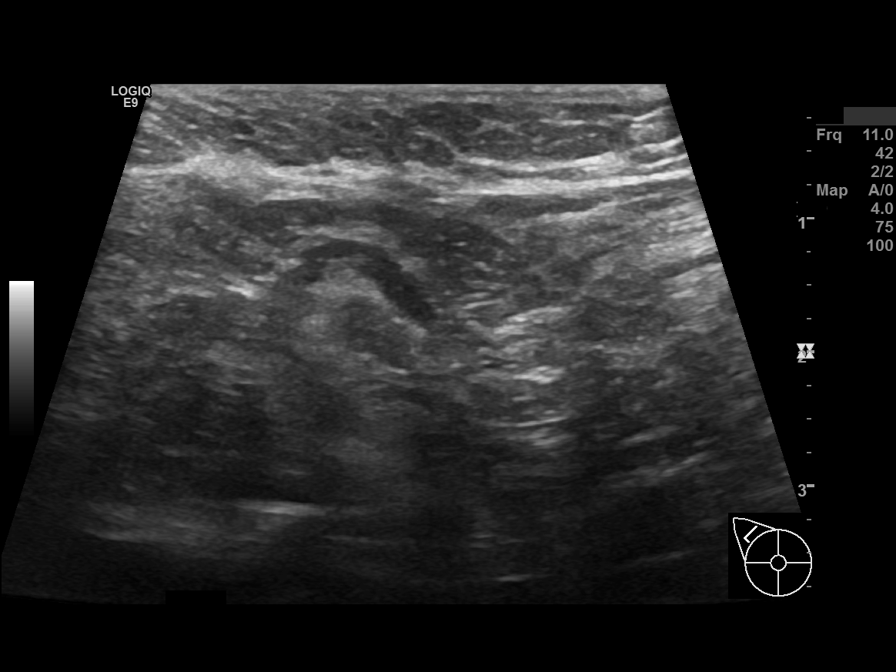
[im 7/10]
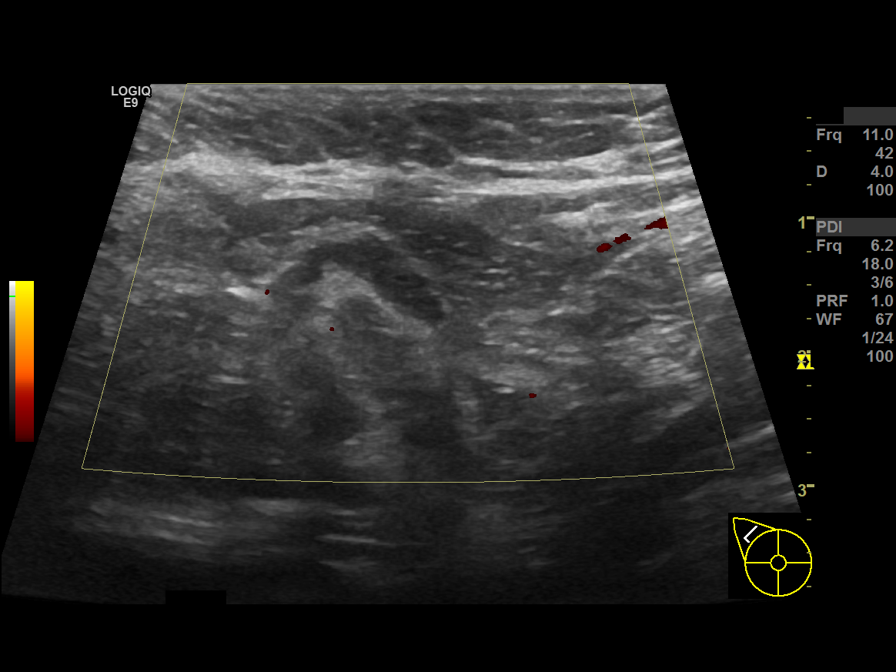
[im 8/10]
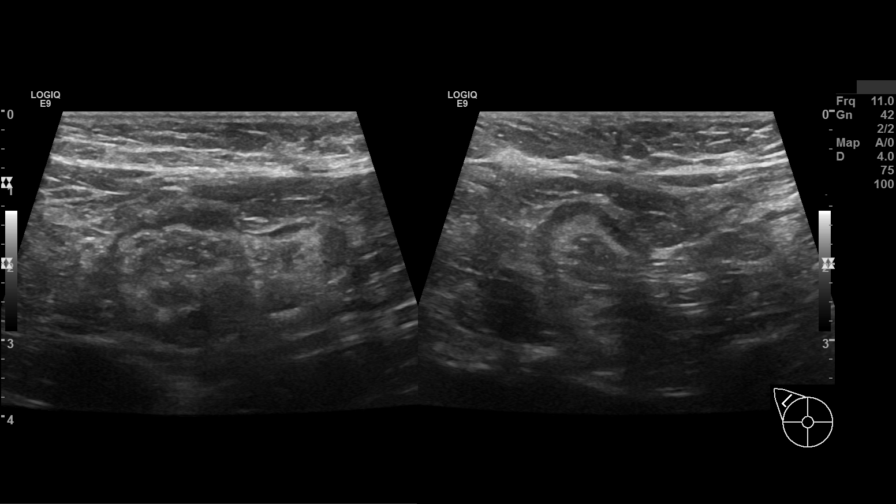
[im 9/10]
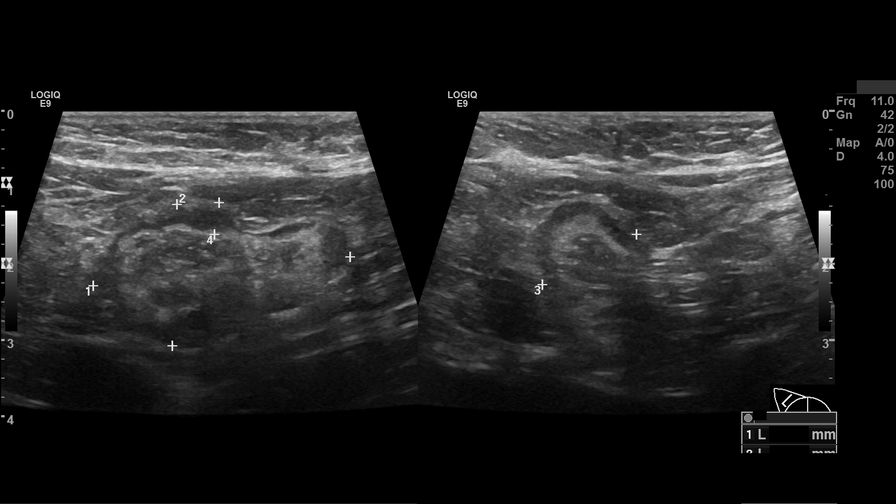
[im 10/10]
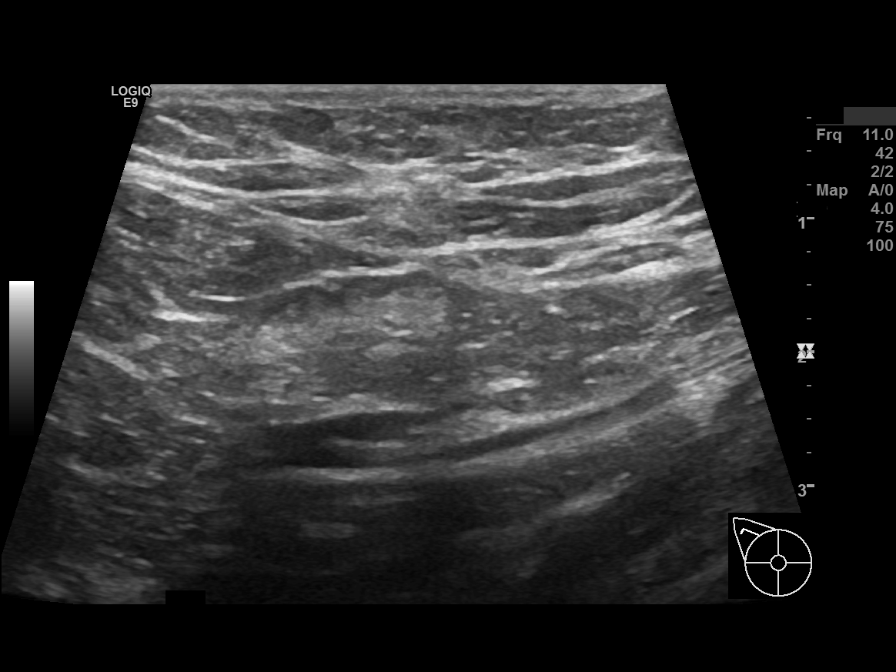

[10 of 10 positions shown; findings below may reference images not displayed]

FINDINGS: In the right axilla, there is a lymph node with a cortical thickness of 0.4 cm. The lymph node and cortical thickness are stable in size and appearance from the prior ultrasound on 11/01/2021. The
cortical thickness had slightly increased in size since the exam on 05/30/2021.
IMPRESSION: The right axillary lymph node is suspicious. An ultrasound-guided biopsy is recommended and will be performed today.
FINAL ASSESSMENT: BI-RADS: Category 4 Suspicious
Results and recommendations were discussed with the patient.

## 2021-12-27 IMAGING — US BX AXILLARY BREAST LYMPH NODE RT
1 series · 13 of 18 positions shown · non-contrast
Comparison: Comparison was made to multiple prior studies.

Images Obtained from [facility]
INDICATION: Biopsy of a right axillary lymph node.

[Series 3: bx axillary breast lymph node right · 13 of 18 slices shown]
[im 1/18]
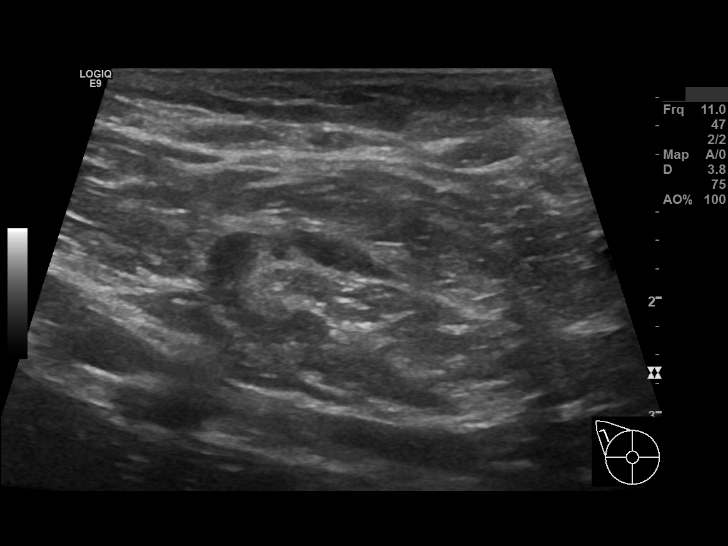
[im 3/18]
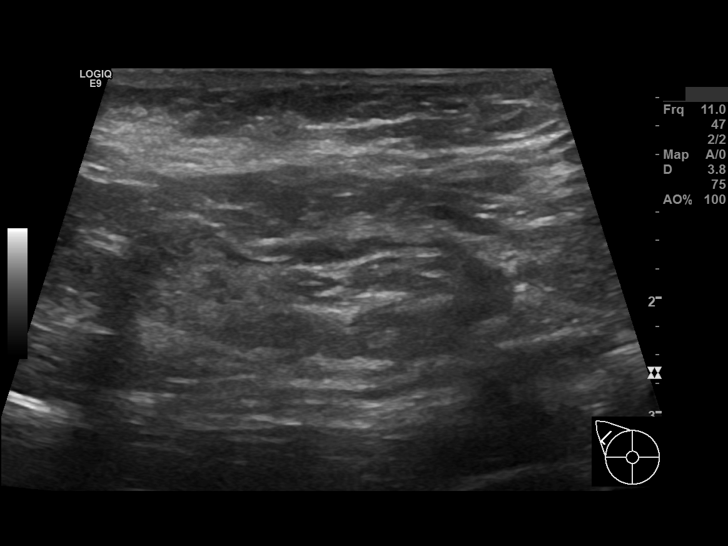
[im 4/18]
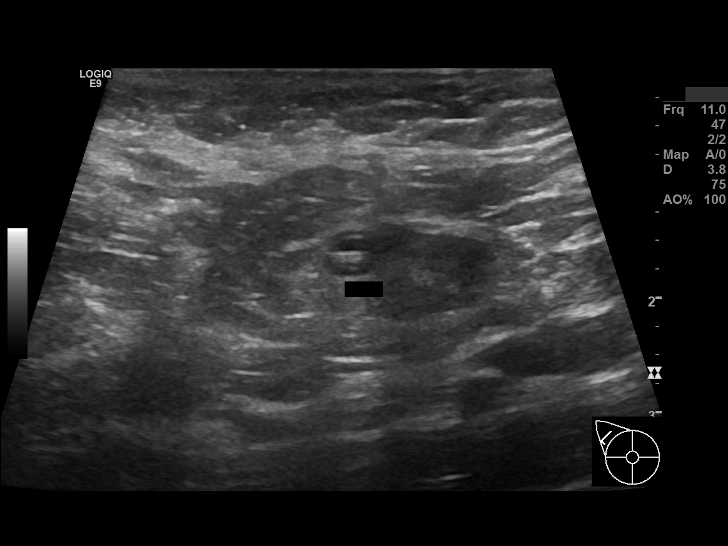
[im 5/18]
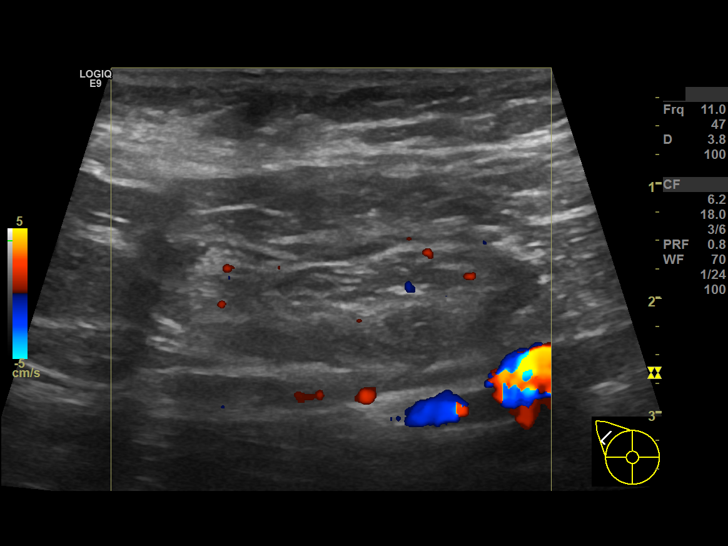
[im 7/18]
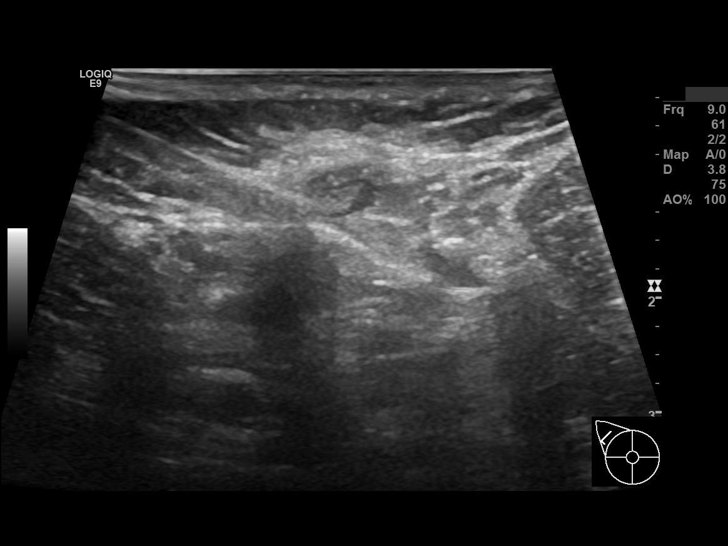
[im 8/18]
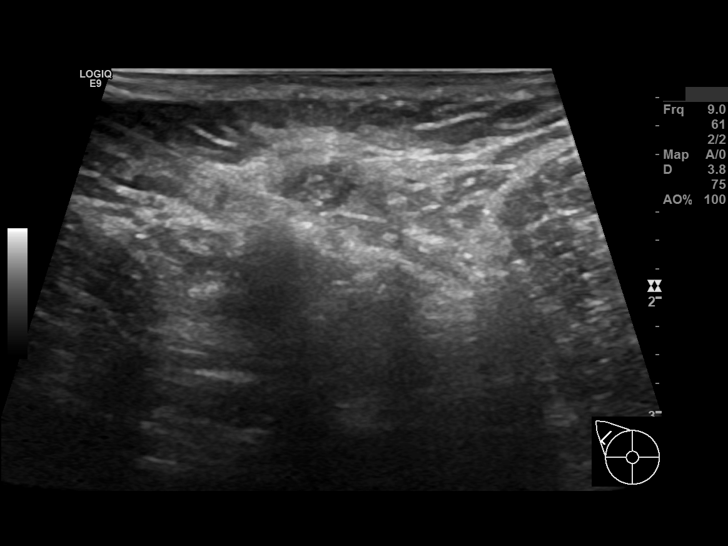
[im 10/18]
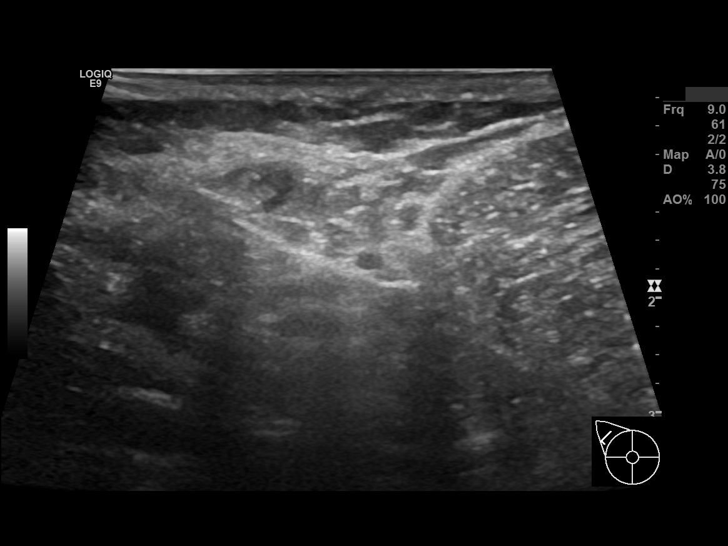
[im 11/18]
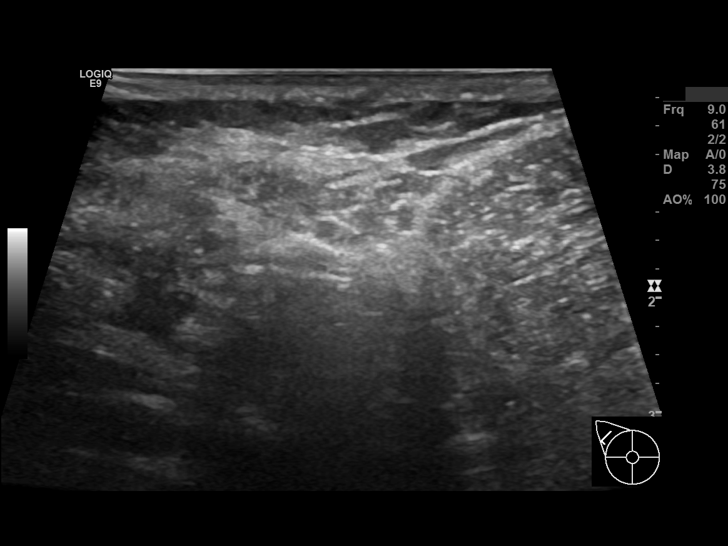
[im 12/18]
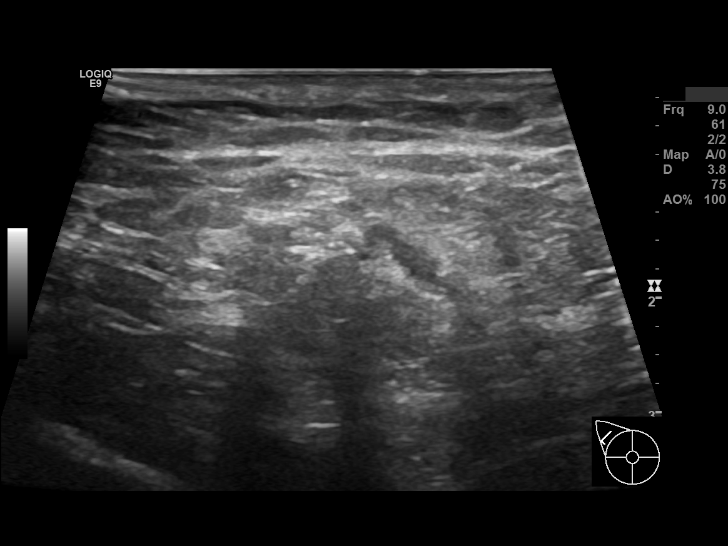
[im 14/18]
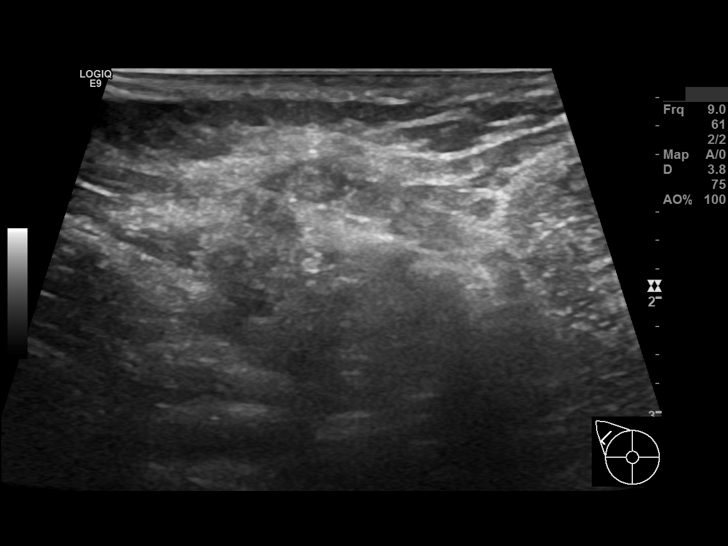
[im 15/18]
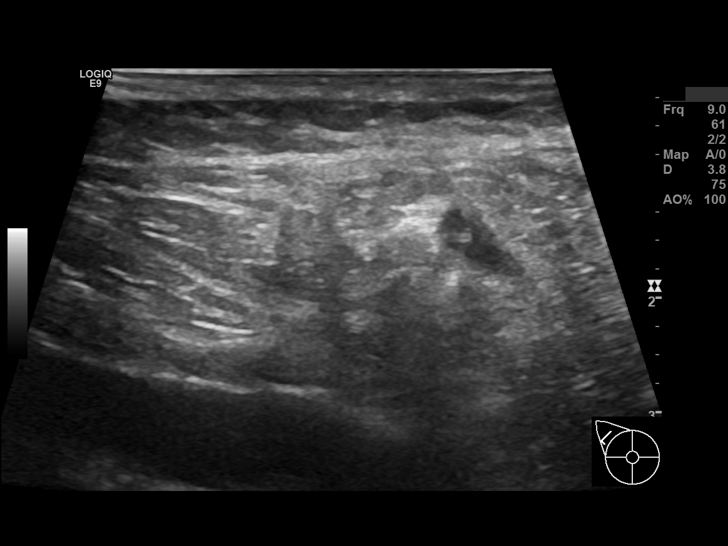
[im 16/18]
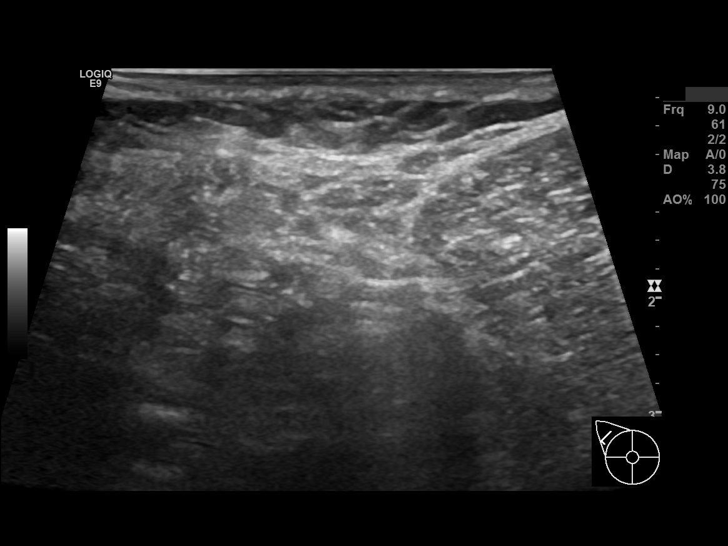
[im 18/18]
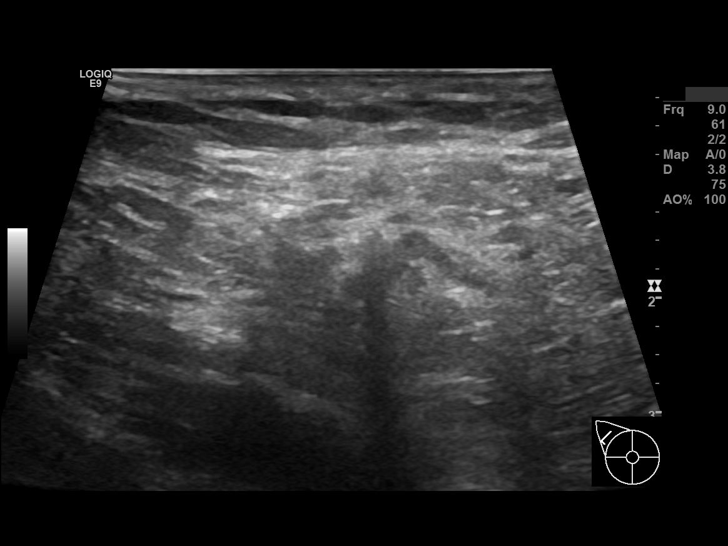

[13 of 18 positions shown; findings below may reference images not displayed]

PATIENT CONSENT: Prior to the procedure risks, complications, alternatives, and a description of the procedure were discussed. All questions were answered.  Informed consent was obtained and signed.
PROCEDURE DESCRIPTION:
An ultrasound guided biopsy using real-time ultrasound was performed for the right axillary lymph node.  This was described on the previous ultrasound report.  The skin was prepped in the usual
manner.  Local anesthetic was administered to the access site.  A skin nick was made in the breast.  A 14 gauge biopsy needle was placed adjacent to the abnormality under ultrasound guidance.  Once
the needle was documented to be in the correct location, multiple specimens were obtained using the Marquee biopsy needle. A second clip was not placed within this lymph node, as one was placed on
the prior biopsy on 12/14/2021.  A skin adhesive was applied to the access site. The specimens were sent to the laboratory for pathological analysis/RPMI.
A female technologist was present throughout the procedure.
IMPRESSION: BENIGN
1.  The ultrasound guided biopsy of the right axillary lymph node was successful with no apparent post procedure complications.
2.  Pathology indicates lymph node with features most suggestive of reactive changes.
3.  Pathology results are concordant with imaging findings.
4.  After discussion with the patient, he desires complete excision of this lymph node. A surgical referral is recommended.
Results and recommendations were discussed with the patient.

## 2022-07-23 IMAGING — MR MRI WRIST RT WO CONTRAST
6 of 7 series · 37 of 40 positions shown · non-contrast
Comparison: None.

Images Obtained from [facility]
MRI WRIST RT WO CONTRAST
INDICATION: Pain in right wrist.
TECHNIQUE: Multiplanar, multiecho imaging of the right wrist was performed, including T1-weighted and fluid sensitive sequences without intravenous contrast administration.

[Series 4: t1_axial · oblique · right · 3.0mm · 0.26mm/px · 6 of 22 slices shown]
[im 1/22]
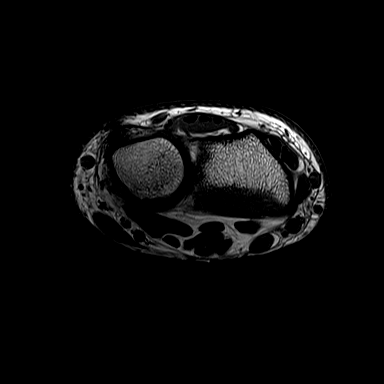
[im 5/22]
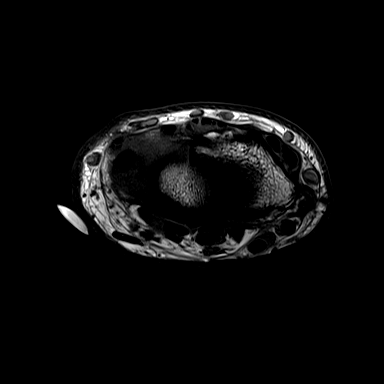
[im 9/22]
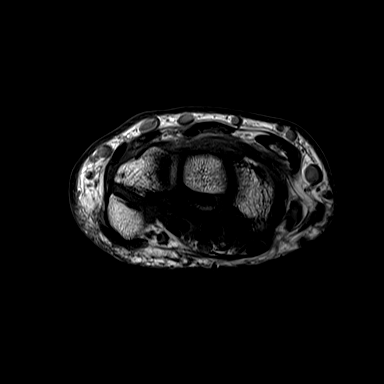
[im 13/22]
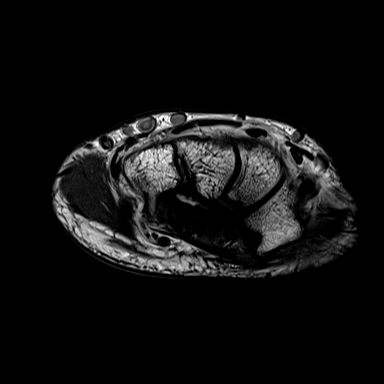
[im 17/22]
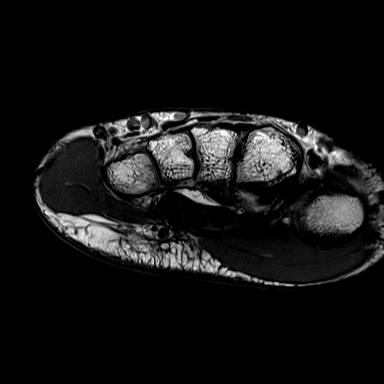
[im 22/22]
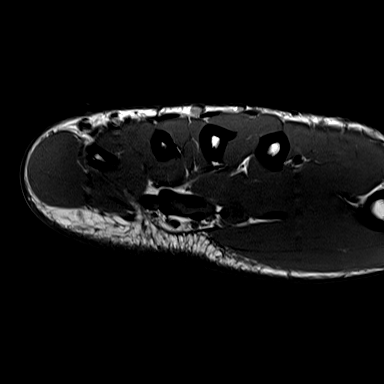

[Series 5: t2_axial_fs · oblique · right · 3.0mm · 0.31mm/px · 6 of 22 slices shown]
[im 1/22]
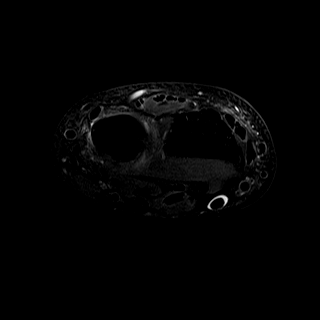
[im 5/22]
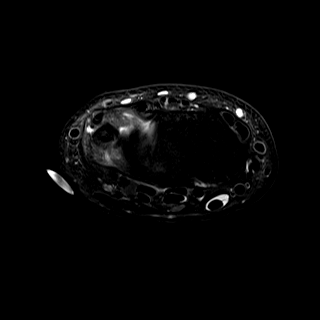
[im 9/22]
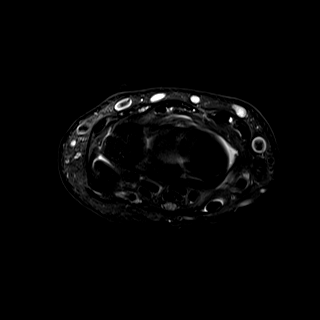
[im 13/22]
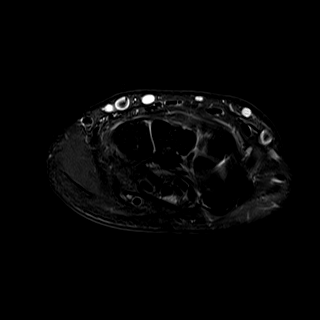
[im 17/22]
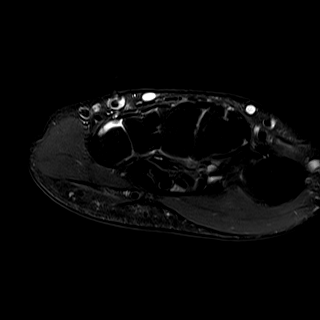
[im 22/22]
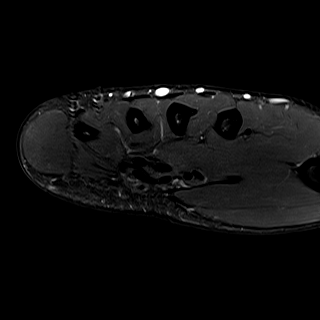

[Series 6: t1_cor · coronal · right · 3.0mm · 0.26mm/px · 4 of 14 slices shown]
[im 1/14]
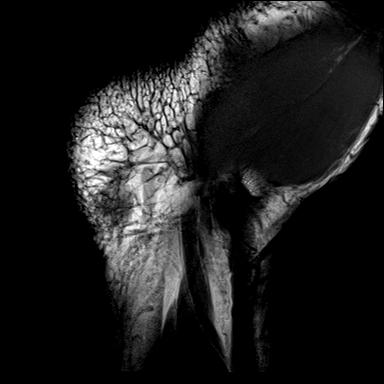
[im 5/14]
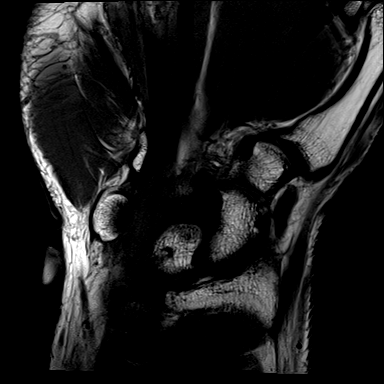
[im 9/14]
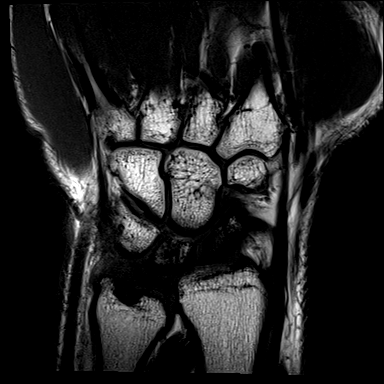
[im 14/14]
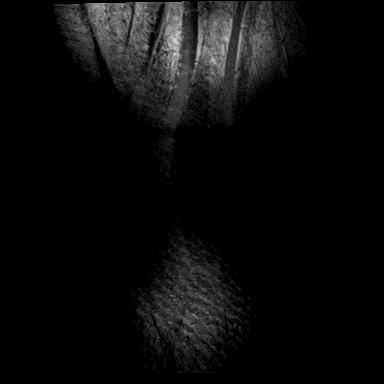

[Series 7: t2_cor_fs · coronal · right · 3.0mm · 0.31mm/px · 4 of 14 slices shown]
[im 1/14]
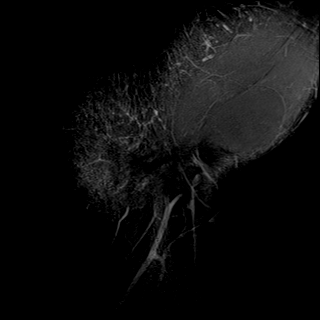
[im 5/14]
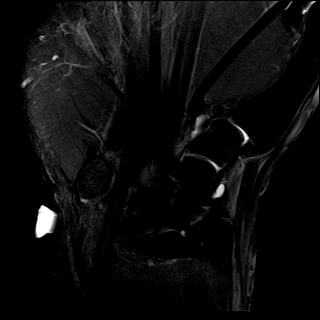
[im 9/14]
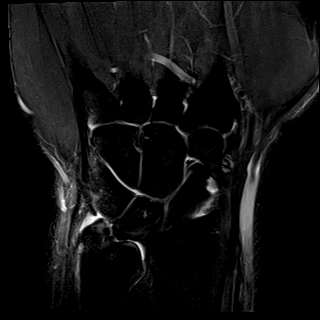
[im 14/14]
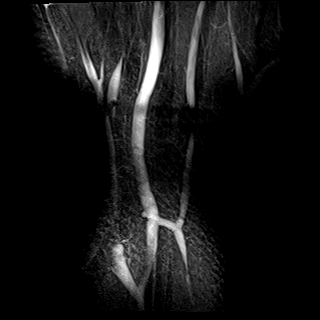

[Series 9: t2_cor_fs_space · coronal · right · 0.9mm · 0.33mm/px · 12 of 44 slices shown]
[im 1/44]
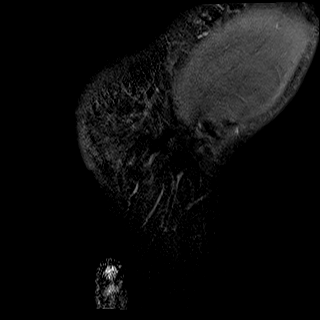
[im 4/44]
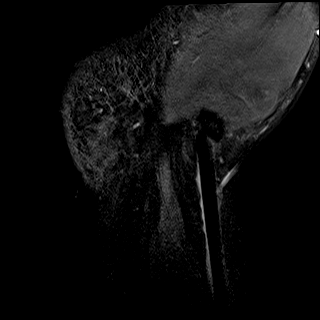
[im 8/44]
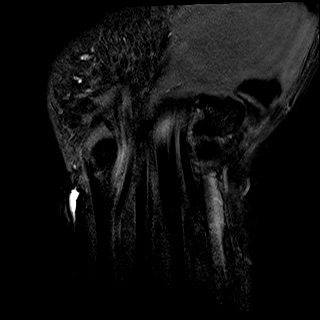
[im 12/44]
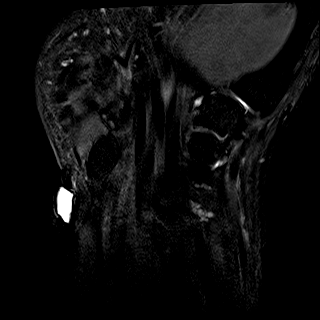
[im 16/44]
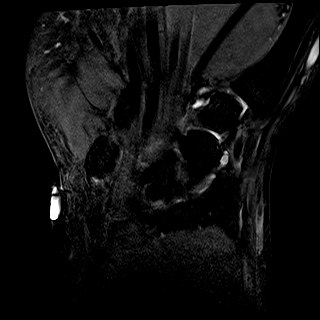
[im 20/44]
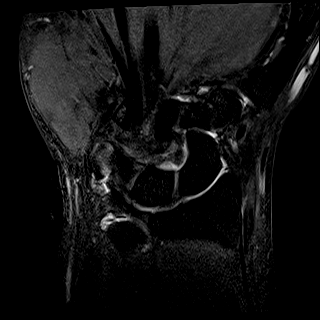
[im 24/44]
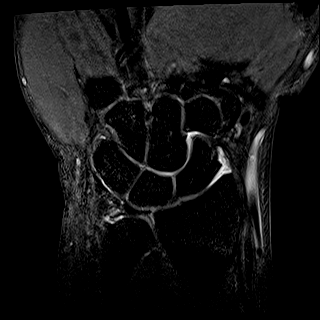
[im 28/44]
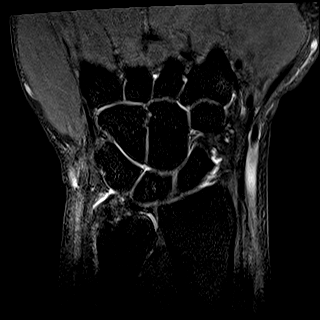
[im 32/44]
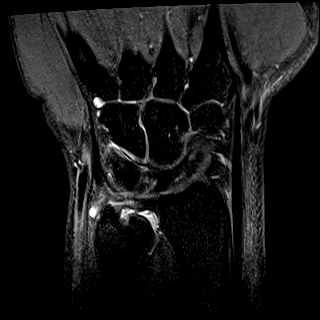
[im 36/44]
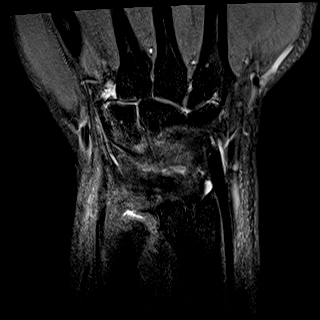
[im 40/44]
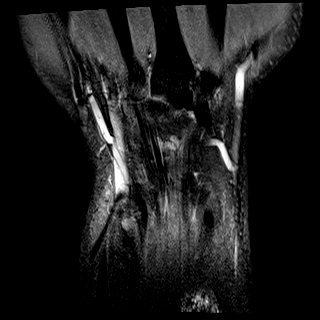
[im 44/44]
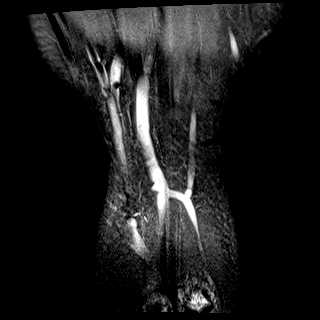

[Series 10: t2_sag_fs · oblique · right · 3.0mm · 0.39mm/px · 5 of 20 slices shown]
[im 1/20]
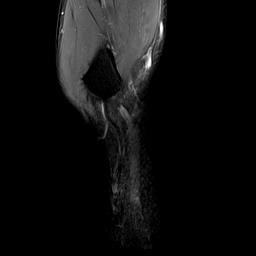
[im 5/20]
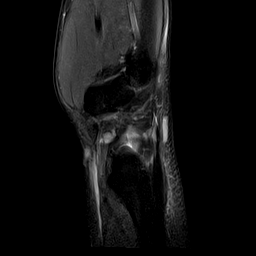
[im 10/20]
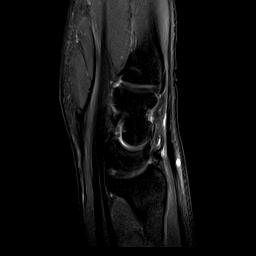
[im 15/20]
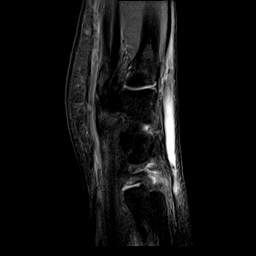
[im 20/20]
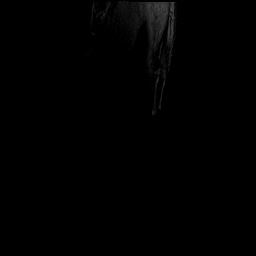

[37 of 40 positions shown; findings below may reference images not displayed]

FINDINGS: Extensor tendons: Short segment longitudinal split tear of the extensor carpi ulnaris tendon, at the level of the ulnar styloid. Trace tenosynovitis of the supraspinatus tendon.
Flexor tendons:  Moderate tenosynovitis of the flexor carpi radialis, at the level of the radial styloid. No tear.
Triangular fibrocartilage complex: Partial-thickness tearing of the TFC, at the ulnar styloid attachment, indeterminate age. Thickening and scarring of the ulnar collateral ligament of the wrist,
indeterminate age and may be deficient.
Scapholunate ligament: Intact.
Lunotriquetral ligament: Intact.
Carpal bones: No acute fracture. No erosion. No bone contusion. No AVN.
Cartilage: Intact.
Carpal tunnel: Normal.
Other: No acute muscle injury. No muscle atrophy. No fluid collection. No soft tissue mass. Mild soft tissue edema near the ulnar styloid.
IMPRESSION: 1.  Short segment longitudinal split tear of the extensor carpi ulnaris, at the level of the ulnar styloid.
2.  Moderate tenosynovitis of the flexor carpi radialis, at the level of the radial styloid.
3.  Partial-thickness tearing of the TFC, at the ulnar styloid attachment, indeterminate age.
4.  Thickening and scarring of the ulnar collateral ligament of the wrist, indeterminate age and may be deficient.

## 2022-08-18 IMAGING — US US BREAST BIL LTD
1 series · 14 of 24 positions shown · non-contrast
Comparison: Multiple prior exams most recently 12/27/2021

Images Obtained from [facility]
INDICATION: History of right gynecomastia. Left breast pain. History of benign lymph node biopsy.
TECHNIQUE: Real-time ultrasound of both breasts was performed.

[Series 1: us breast bil ltd · 14 of 24 slices shown]
[im 1/24]
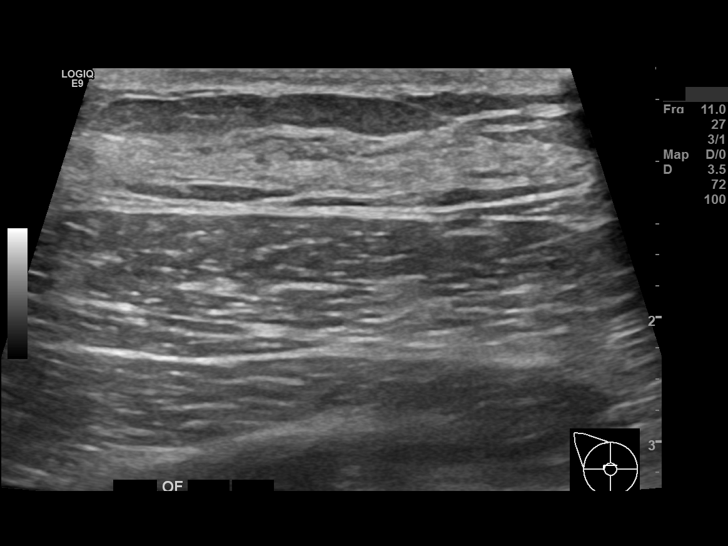
[im 3/24]
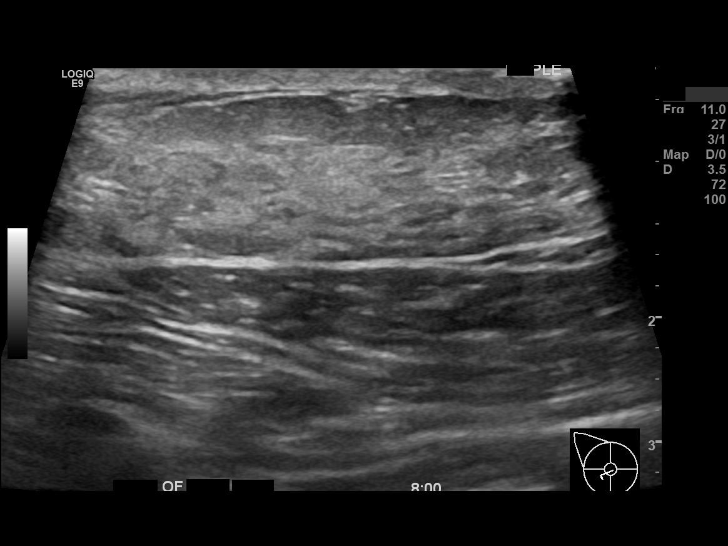
[im 5/24]
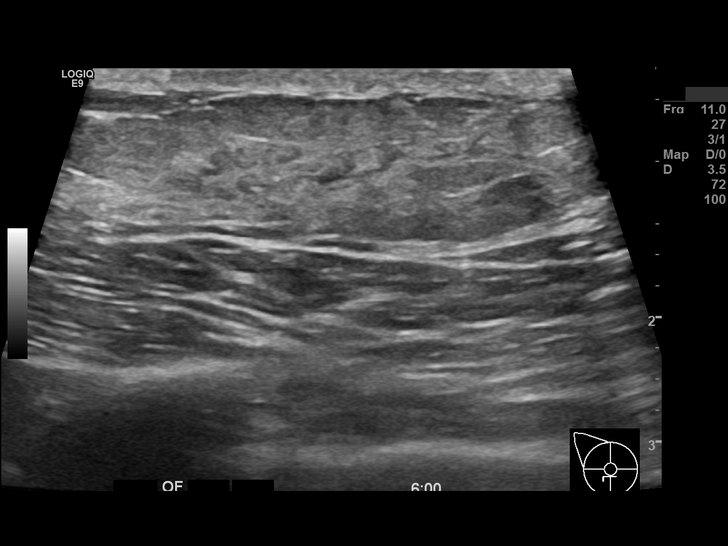
[im 7/24]
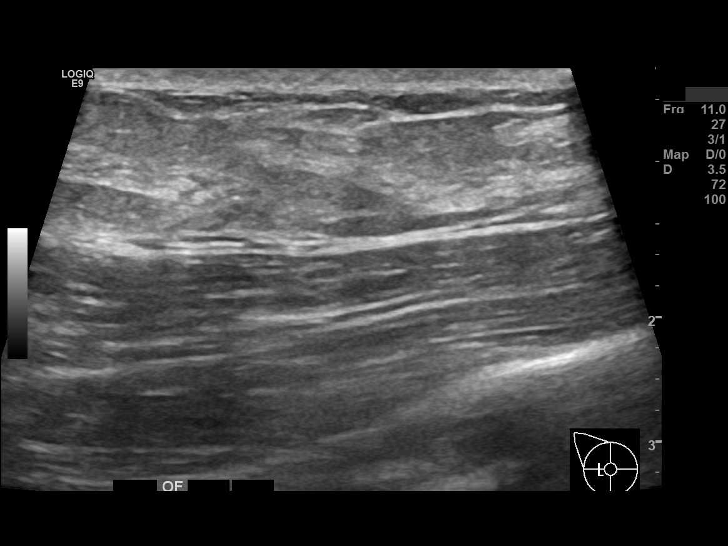
[im 8/24]
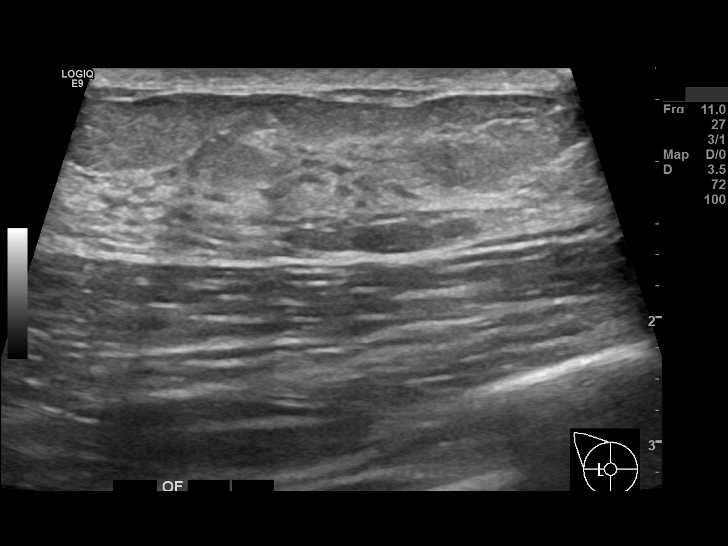
[im 10/24]
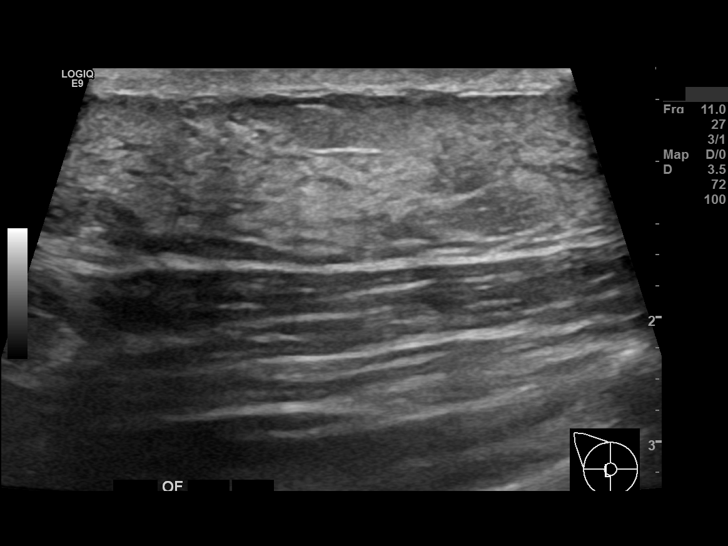
[im 12/24]
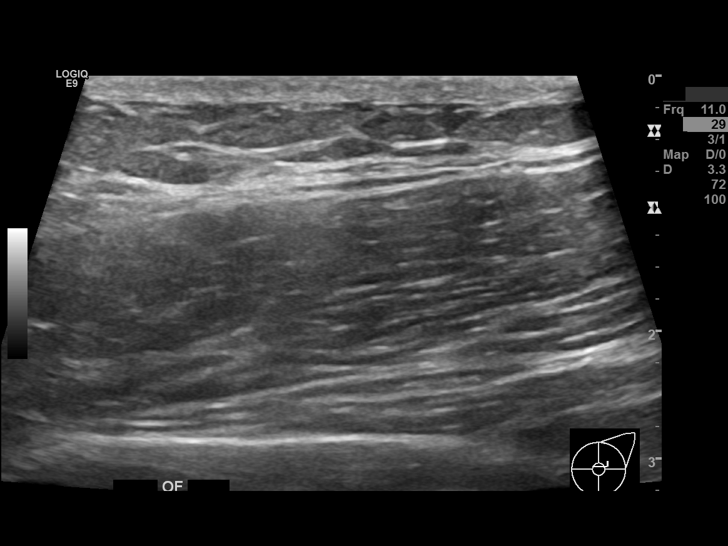
[im 13/24]
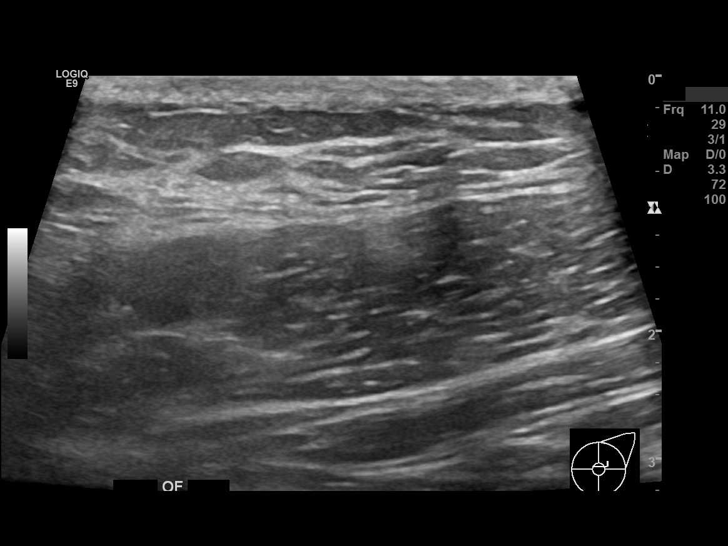
[im 15/24]
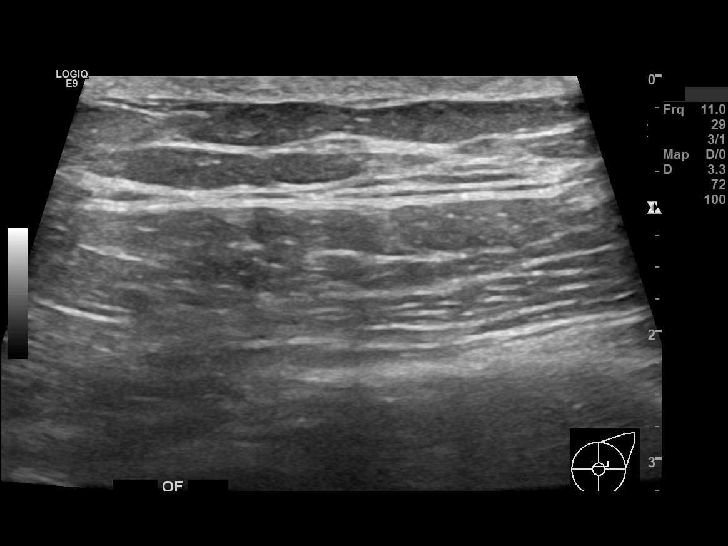
[im 17/24]
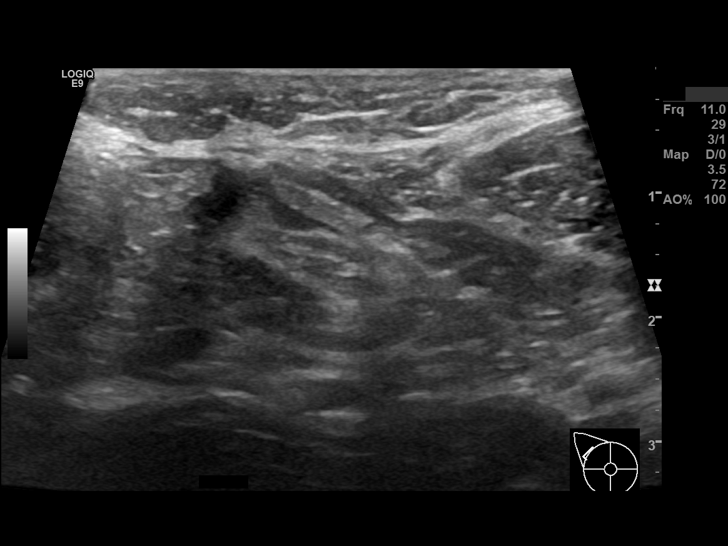
[im 19/24]
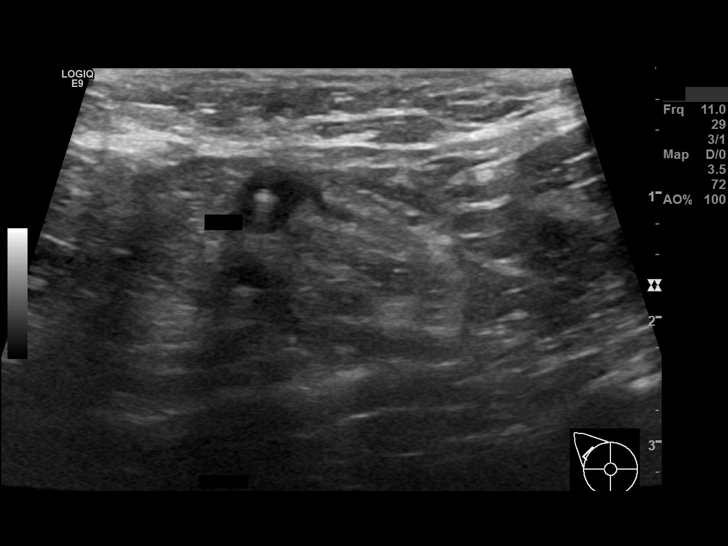
[im 20/24]
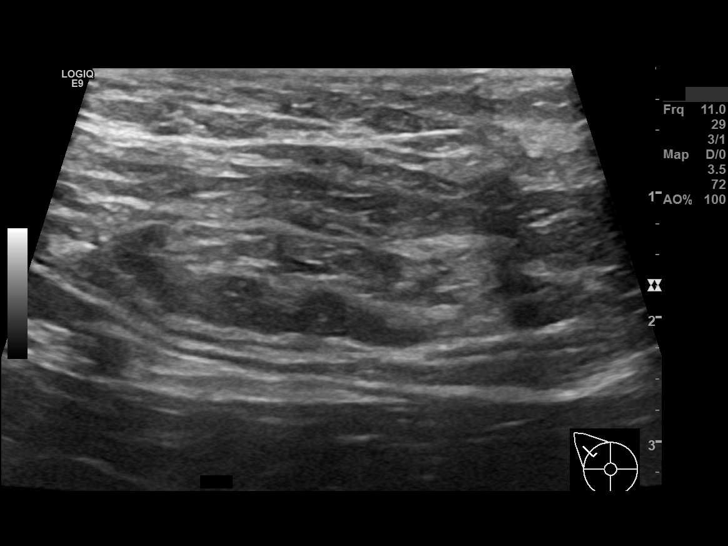
[im 22/24]
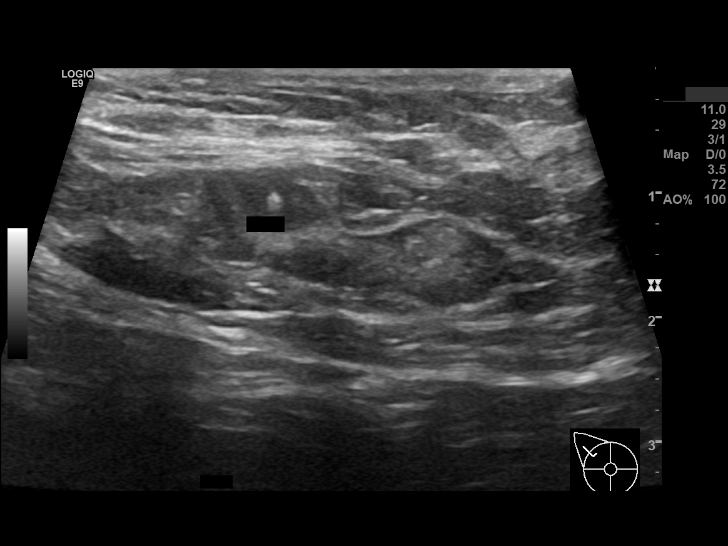
[im 24/24]
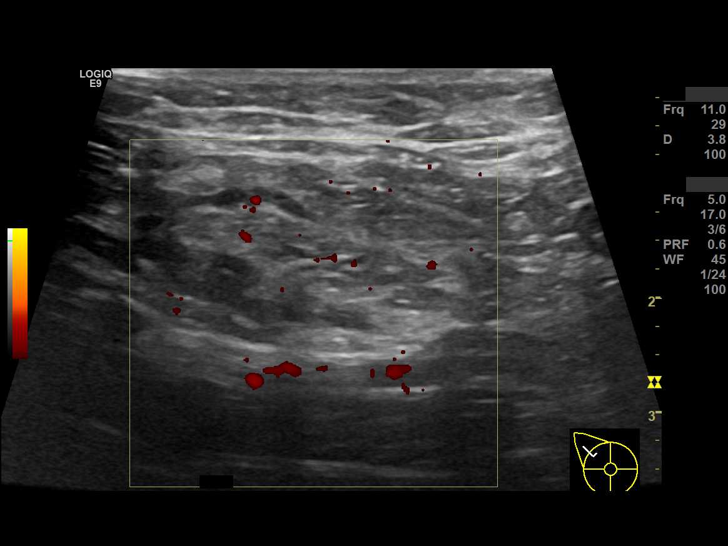

[14 of 24 positions shown; findings below may reference images not displayed]

FINDINGS: Again noted is severe gynecomastia in the retroareolar right breast.
No significant gynecomastia is seen on the left.
The previously biopsied benign right axillary lymph node is unchanged.
No suspicious findings on either side.
IMPRESSION: 1. Severe right gynecomastia. Consider surgical consultation if symptoms warrant.
2. No significant gynecomastia seen on the left.
The patient received a copy of the results at the end of the examination.
FINAL ASSESSMENT: BI-RADS: Category 2 Benign
